# Patient Record
Sex: Female | Born: 1945 | Race: White | Hispanic: No | Marital: Single
Health system: Southern US, Community
[De-identification: ages and names within clinical notes are randomized; demographics above are authoritative.]

---

## 2003-09-26 ENCOUNTER — Other Ambulatory Visit: Payer: Self-pay

## 2004-02-05 ENCOUNTER — Other Ambulatory Visit: Payer: Self-pay

## 2004-02-07 ENCOUNTER — Other Ambulatory Visit: Payer: Self-pay

## 2004-05-27 ENCOUNTER — Other Ambulatory Visit: Payer: Self-pay

## 2004-08-16 ENCOUNTER — Other Ambulatory Visit: Payer: Self-pay

## 2004-08-16 ENCOUNTER — Inpatient Hospital Stay: Payer: Self-pay | Admitting: Internal Medicine

## 2004-11-18 ENCOUNTER — Other Ambulatory Visit: Payer: Self-pay

## 2004-11-18 ENCOUNTER — Emergency Department: Payer: Self-pay | Admitting: Emergency Medicine

## 2004-12-03 ENCOUNTER — Inpatient Hospital Stay: Payer: Self-pay | Admitting: Internal Medicine

## 2005-01-15 ENCOUNTER — Emergency Department: Payer: Self-pay | Admitting: Emergency Medicine

## 2005-01-25 ENCOUNTER — Ambulatory Visit: Payer: Self-pay | Admitting: Specialist

## 2005-02-02 ENCOUNTER — Emergency Department: Payer: Self-pay | Admitting: Emergency Medicine

## 2005-02-03 ENCOUNTER — Ambulatory Visit: Payer: Self-pay | Admitting: Emergency Medicine

## 2005-03-28 ENCOUNTER — Emergency Department: Payer: Self-pay | Admitting: Emergency Medicine

## 2005-03-28 ENCOUNTER — Other Ambulatory Visit: Payer: Self-pay

## 2005-04-06 ENCOUNTER — Ambulatory Visit: Payer: Self-pay | Admitting: Family Medicine

## 2005-04-12 ENCOUNTER — Ambulatory Visit: Payer: Self-pay | Admitting: Family Medicine

## 2005-05-22 ENCOUNTER — Other Ambulatory Visit: Payer: Self-pay

## 2005-05-22 ENCOUNTER — Emergency Department: Payer: Self-pay | Admitting: Emergency Medicine

## 2005-07-16 ENCOUNTER — Other Ambulatory Visit: Payer: Self-pay

## 2005-07-16 ENCOUNTER — Inpatient Hospital Stay: Payer: Self-pay | Admitting: Internal Medicine

## 2005-12-15 ENCOUNTER — Ambulatory Visit: Payer: Self-pay | Admitting: Gastroenterology

## 2005-12-27 ENCOUNTER — Ambulatory Visit: Payer: Self-pay | Admitting: Gastroenterology

## 2005-12-28 ENCOUNTER — Ambulatory Visit: Payer: Self-pay | Admitting: Gastroenterology

## 2006-02-24 ENCOUNTER — Emergency Department: Payer: Self-pay | Admitting: Emergency Medicine

## 2006-02-24 ENCOUNTER — Other Ambulatory Visit: Payer: Self-pay

## 2006-03-04 ENCOUNTER — Ambulatory Visit: Payer: Self-pay | Admitting: Cardiology

## 2006-10-15 ENCOUNTER — Other Ambulatory Visit: Payer: Self-pay

## 2006-10-15 ENCOUNTER — Inpatient Hospital Stay: Payer: Self-pay | Admitting: *Deleted

## 2006-10-16 ENCOUNTER — Other Ambulatory Visit: Payer: Self-pay

## 2006-10-18 ENCOUNTER — Other Ambulatory Visit: Payer: Self-pay

## 2006-10-19 ENCOUNTER — Other Ambulatory Visit: Payer: Self-pay

## 2007-01-09 ENCOUNTER — Emergency Department: Payer: Self-pay | Admitting: Emergency Medicine

## 2007-01-09 ENCOUNTER — Other Ambulatory Visit: Payer: Self-pay

## 2007-03-02 ENCOUNTER — Ambulatory Visit: Payer: Self-pay | Admitting: Gastroenterology

## 2007-07-17 ENCOUNTER — Other Ambulatory Visit: Payer: Self-pay

## 2007-07-17 ENCOUNTER — Inpatient Hospital Stay: Payer: Self-pay | Admitting: Internal Medicine

## 2008-01-18 ENCOUNTER — Ambulatory Visit: Payer: Self-pay | Admitting: Specialist

## 2008-03-15 ENCOUNTER — Ambulatory Visit: Payer: Self-pay | Admitting: Specialist

## 2008-04-19 ENCOUNTER — Ambulatory Visit: Payer: Self-pay | Admitting: Specialist

## 2008-04-20 ENCOUNTER — Inpatient Hospital Stay: Payer: Self-pay | Admitting: Internal Medicine

## 2008-04-20 ENCOUNTER — Other Ambulatory Visit: Payer: Self-pay

## 2008-07-02 ENCOUNTER — Ambulatory Visit: Payer: Self-pay | Admitting: Gastroenterology

## 2008-07-03 ENCOUNTER — Ambulatory Visit: Payer: Self-pay | Admitting: Gastroenterology

## 2008-08-15 ENCOUNTER — Ambulatory Visit: Payer: Self-pay | Admitting: Internal Medicine

## 2010-03-07 ENCOUNTER — Ambulatory Visit: Payer: Self-pay | Admitting: Internal Medicine

## 2011-03-17 ENCOUNTER — Ambulatory Visit: Payer: Self-pay

## 2013-01-31 ENCOUNTER — Inpatient Hospital Stay: Payer: Self-pay | Admitting: Internal Medicine

## 2013-01-31 LAB — CBC WITH DIFFERENTIAL/PLATELET
Basophil %: 0.9 %
Eosinophil %: 0.7 %
HCT: 37.3 % (ref 35.0–47.0)
MCH: 25.7 pg — ABNORMAL LOW (ref 26.0–34.0)
MCHC: 31.4 g/dL — ABNORMAL LOW (ref 32.0–36.0)
Monocyte %: 5.6 %
Neutrophil #: 10.3 10*3/uL — ABNORMAL HIGH (ref 1.4–6.5)
Neutrophil %: 64.1 %
RBC: 4.56 10*6/uL (ref 3.80–5.20)
RDW: 15.4 % — ABNORMAL HIGH (ref 11.5–14.5)
WBC: 16.1 10*3/uL — ABNORMAL HIGH (ref 3.6–11.0)

## 2013-01-31 LAB — TROPONIN I: Troponin-I: <0.02 ng/mL

## 2013-01-31 LAB — PRO B NATRIURETIC PEPTIDE: B-Type Natriuretic Peptide: 56 pg/mL (ref 0–125)

## 2013-01-31 LAB — COMPREHENSIVE METABOLIC PANEL
Albumin: 3.4 g/dL (ref 3.4–5.0)
Alkaline Phosphatase: 103 U/L (ref 50–136)
Anion Gap: 3 — ABNORMAL LOW (ref 7–16)
Bilirubin,Total: 0.3 mg/dL (ref 0.2–1.0)
Calcium, Total: 8.8 mg/dL (ref 8.5–10.1)
Co2: 31 mmol/L (ref 21–32)
Creatinine: 0.88 mg/dL (ref 0.60–1.30)
EGFR (Non-African Amer.): >60
Glucose: 220 mg/dL — ABNORMAL HIGH (ref 65–99)
Potassium: 4.5 mmol/L (ref 3.5–5.1)
Total Protein: 7.6 g/dL (ref 6.4–8.2)

## 2013-02-01 LAB — BASIC METABOLIC PANEL
Anion Gap: 7 (ref 7–16)
BUN: 20 mg/dL — ABNORMAL HIGH (ref 7–18)
Calcium, Total: 8.8 mg/dL (ref 8.5–10.1)
Chloride: 97 mmol/L — ABNORMAL LOW (ref 98–107)
Co2: 28 mmol/L (ref 21–32)
Creatinine: 0.75 mg/dL (ref 0.60–1.30)
EGFR (Non-African Amer.): >60
Glucose: 222 mg/dL — ABNORMAL HIGH (ref 65–99)
Sodium: 132 mmol/L — ABNORMAL LOW (ref 136–145)

## 2013-02-01 LAB — CBC WITH DIFFERENTIAL/PLATELET
Basophil #: 0.1 10*3/uL (ref 0.0–0.1)
Eosinophil #: 0 10*3/uL (ref 0.0–0.7)
HCT: 34.2 % — ABNORMAL LOW (ref 35.0–47.0)
HGB: 10.5 g/dL — ABNORMAL LOW (ref 12.0–16.0)
Lymphocyte #: 1 10*3/uL (ref 1.0–3.6)
MCHC: 30.7 g/dL — ABNORMAL LOW (ref 32.0–36.0)
MCV: 82 fL (ref 80–100)
Monocyte %: 0.6 %
Neutrophil #: 10.7 10*3/uL — ABNORMAL HIGH (ref 1.4–6.5)
Platelet: 199 10*3/uL (ref 150–440)
WBC: 11.8 10*3/uL — ABNORMAL HIGH (ref 3.6–11.0)

## 2013-02-01 LAB — HEMOGLOBIN A1C: Hemoglobin A1C: 8 % — ABNORMAL HIGH (ref 4.2–6.3)

## 2013-02-01 LAB — LIPID PANEL
Cholesterol: 169 mg/dL (ref 0–200)
Triglycerides: 109 mg/dL (ref 0–200)

## 2013-02-05 DIAGNOSIS — I509 Heart failure, unspecified: Secondary | ICD-10-CM

## 2013-02-05 LAB — CBC WITH DIFFERENTIAL/PLATELET
HCT: 33.1 % — ABNORMAL LOW (ref 35.0–47.0)
HGB: 10.6 g/dL — ABNORMAL LOW (ref 12.0–16.0)
Lymphocyte #: 0.5 10*3/uL — ABNORMAL LOW (ref 1.0–3.6)
Lymphocyte %: 5.5 %
MCHC: 32 g/dL (ref 32.0–36.0)
MCV: 82 fL (ref 80–100)
Neutrophil #: 8.5 10*3/uL — ABNORMAL HIGH (ref 1.4–6.5)
Neutrophil %: 89.6 %
RBC: 4.03 10*6/uL (ref 3.80–5.20)

## 2013-02-05 LAB — PRO B NATRIURETIC PEPTIDE: B-Type Natriuretic Peptide: 139 pg/mL — ABNORMAL HIGH (ref 0–125)

## 2013-02-05 LAB — BASIC METABOLIC PANEL
BUN: 40 mg/dL — ABNORMAL HIGH (ref 7–18)
Chloride: 95 mmol/L — ABNORMAL LOW (ref 98–107)
Creatinine: 0.93 mg/dL (ref 0.60–1.30)
EGFR (African American): >60
Glucose: 248 mg/dL — ABNORMAL HIGH (ref 65–99)
Potassium: 4.9 mmol/L (ref 3.5–5.1)

## 2013-02-05 LAB — PLATELET COUNT: Platelet: 174 10*3/uL (ref 150–440)

## 2013-02-06 LAB — CULTURE, BLOOD (SINGLE)

## 2013-02-09 LAB — BASIC METABOLIC PANEL
BUN: 38 mg/dL — ABNORMAL HIGH (ref 7–18)
Calcium, Total: 8.7 mg/dL (ref 8.5–10.1)
Chloride: 87 mmol/L — ABNORMAL LOW (ref 98–107)
Co2: 43 mmol/L (ref 21–32)
Creatinine: 0.72 mg/dL (ref 0.60–1.30)
EGFR (African American): >60
EGFR (Non-African Amer.): >60
Glucose: 263 mg/dL — ABNORMAL HIGH (ref 65–99)
Potassium: 5.3 mmol/L — ABNORMAL HIGH (ref 3.5–5.1)
Sodium: 134 mmol/L — ABNORMAL LOW (ref 136–145)

## 2013-02-09 LAB — CBC WITH DIFFERENTIAL/PLATELET
Comment - H1-Com1: NORMAL
HCT: 34.3 % — ABNORMAL LOW (ref 35.0–47.0)
Lymphocytes: 6 %
MCHC: 31.3 g/dL — ABNORMAL LOW (ref 32.0–36.0)
Monocytes: 5 %
Myelocyte: 1 %
RDW: 15.4 % — ABNORMAL HIGH (ref 11.5–14.5)
Segmented Neutrophils: 88 %
WBC: 14.8 10*3/uL — ABNORMAL HIGH (ref 3.6–11.0)

## 2013-02-20 ENCOUNTER — Ambulatory Visit: Payer: Self-pay | Admitting: Nurse Practitioner

## 2013-02-26 ENCOUNTER — Inpatient Hospital Stay: Payer: Self-pay | Admitting: Specialist

## 2013-02-26 LAB — CBC
HGB: 11.9 g/dL — ABNORMAL LOW (ref 12.0–16.0)
MCV: 82 fL (ref 80–100)
Platelet: 148 10*3/uL — ABNORMAL LOW (ref 150–440)
RBC: 4.46 10*6/uL (ref 3.80–5.20)
RDW: 17 % — ABNORMAL HIGH (ref 11.5–14.5)

## 2013-02-26 LAB — CK TOTAL AND CKMB (NOT AT ARMC)
CK, Total: 42 U/L (ref 21–215)
CK-MB: 0.9 ng/mL (ref 0.5–3.6)

## 2013-02-26 LAB — URINALYSIS, COMPLETE
Bacteria: NONE SEEN
Blood: NEGATIVE
Glucose,UR: NEGATIVE mg/dL (ref 0–75)
Leukocyte Esterase: NEGATIVE
Nitrite: NEGATIVE
Ph: 6 (ref 4.5–8.0)
Protein: NEGATIVE
RBC,UR: NONE SEEN /HPF (ref 0–5)
Squamous Epithelial: <1
WBC UR: 1 /HPF (ref 0–5)

## 2013-02-26 LAB — COMPREHENSIVE METABOLIC PANEL
Albumin: 3.4 g/dL (ref 3.4–5.0)
Alkaline Phosphatase: 58 U/L (ref 50–136)
Anion Gap: 7 (ref 7–16)
BUN: 29 mg/dL — ABNORMAL HIGH (ref 7–18)
Calcium, Total: 9.3 mg/dL (ref 8.5–10.1)
Co2: 35 mmol/L — ABNORMAL HIGH (ref 21–32)
EGFR (Non-African Amer.): 53 — ABNORMAL LOW
Glucose: 221 mg/dL — ABNORMAL HIGH (ref 65–99)
Osmolality: 270 (ref 275–301)
Potassium: 4.2 mmol/L (ref 3.5–5.1)
SGOT(AST): 27 U/L (ref 15–37)
SGPT (ALT): 63 U/L (ref 12–78)
Sodium: 128 mmol/L — ABNORMAL LOW (ref 136–145)
Total Protein: 7.1 g/dL (ref 6.4–8.2)

## 2013-02-26 LAB — PRO B NATRIURETIC PEPTIDE: B-Type Natriuretic Peptide: 93 pg/mL (ref 0–125)

## 2013-02-26 LAB — SODIUM, URINE, RANDOM: Sodium, Urine Random: 49 mmol/L (ref 20–110)

## 2013-02-26 LAB — TROPONIN I
Troponin-I: <0.02 ng/mL
Troponin-I: <0.02 ng/mL

## 2013-02-27 LAB — CBC WITH DIFFERENTIAL/PLATELET
Basophil %: 0.2 %
Eosinophil #: 0 10*3/uL (ref 0.0–0.7)
Eosinophil %: 0 %
HCT: 32.9 % — ABNORMAL LOW (ref 35.0–47.0)
HGB: 10.7 g/dL — ABNORMAL LOW (ref 12.0–16.0)
Lymphocyte %: 14.8 %
MCH: 26.6 pg (ref 26.0–34.0)
MCV: 81 fL (ref 80–100)
Monocyte #: 0.1 x10 3/mm — ABNORMAL LOW (ref 0.2–0.9)
Monocyte %: 2.1 %
Neutrophil %: 82.9 %
RBC: 4.04 10*6/uL (ref 3.80–5.20)
RDW: 16.7 % — ABNORMAL HIGH (ref 11.5–14.5)
WBC: 6 10*3/uL (ref 3.6–11.0)

## 2013-02-27 LAB — BASIC METABOLIC PANEL
Calcium, Total: 8.5 mg/dL (ref 8.5–10.1)
Chloride: 87 mmol/L — ABNORMAL LOW (ref 98–107)
Creatinine: 0.84 mg/dL (ref 0.60–1.30)
EGFR (Non-African Amer.): >60
Osmolality: 273 (ref 275–301)
Sodium: 130 mmol/L — ABNORMAL LOW (ref 136–145)

## 2013-02-27 LAB — LIPID PANEL
HDL Cholesterol: 91 mg/dL — ABNORMAL HIGH (ref 40–60)
Ldl Cholesterol, Calc: 113 mg/dL — ABNORMAL HIGH (ref 0–100)
Triglycerides: 142 mg/dL (ref 0–200)
VLDL Cholesterol, Calc: 28 mg/dL (ref 5–40)

## 2013-02-28 DIAGNOSIS — I509 Heart failure, unspecified: Secondary | ICD-10-CM

## 2013-02-28 DIAGNOSIS — R609 Edema, unspecified: Secondary | ICD-10-CM

## 2013-02-28 DIAGNOSIS — R0602 Shortness of breath: Secondary | ICD-10-CM

## 2013-02-28 LAB — SODIUM: Sodium: 130 mmol/L — ABNORMAL LOW (ref 136–145)

## 2013-03-01 LAB — PHOSPHORUS: Phosphorus: 3.2 mg/dL (ref 2.5–4.9)

## 2013-03-01 LAB — T4, FREE: Free Thyroxine: 0.98 ng/dL (ref 0.76–1.46)

## 2013-03-01 LAB — TSH: Thyroid Stimulating Horm: 0.374 u[IU]/mL — ABNORMAL LOW

## 2013-03-02 DIAGNOSIS — I5031 Acute diastolic (congestive) heart failure: Secondary | ICD-10-CM

## 2013-03-04 LAB — CREATININE, SERUM
EGFR (African American): >60
EGFR (Non-African Amer.): >60

## 2013-03-06 ENCOUNTER — Encounter: Payer: Self-pay | Admitting: Internal Medicine

## 2013-03-22 ENCOUNTER — Ambulatory Visit: Payer: Self-pay | Admitting: Nurse Practitioner

## 2013-03-22 DEATH — deceased

## 2014-10-18 IMAGING — US US EXTREM LOW VENOUS*R*
1 series · 14 of 23 positions shown · non-contrast
Comparison: none

REASON FOR EXAM: swelling
COMMENTS:

[Series 1: us extrem low venous*right* · 0.10mm/px · 14 of 23 slices shown]
[im 1/23]
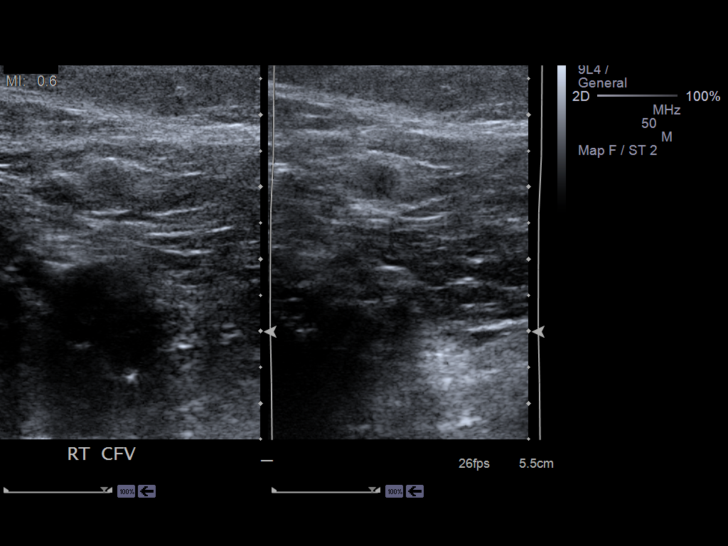
[im 3/23]
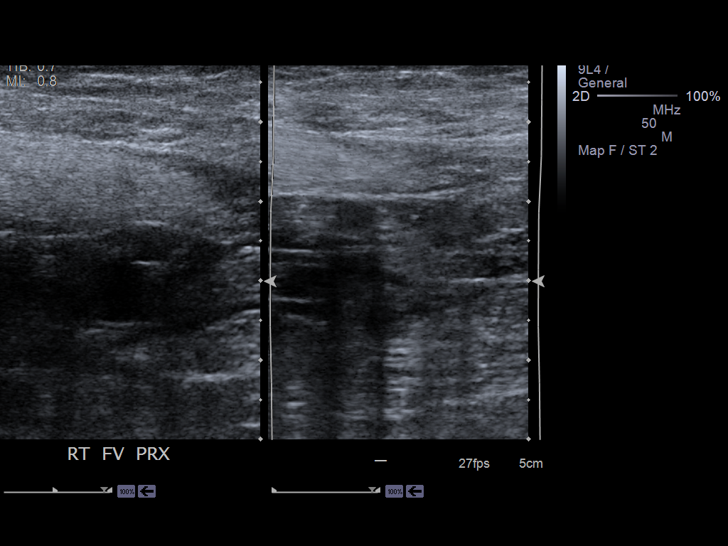
[im 5/23]
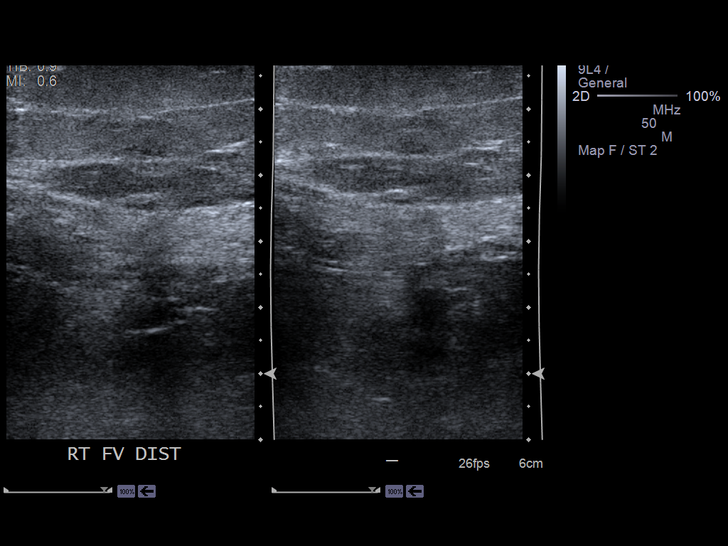
[im 6/23]
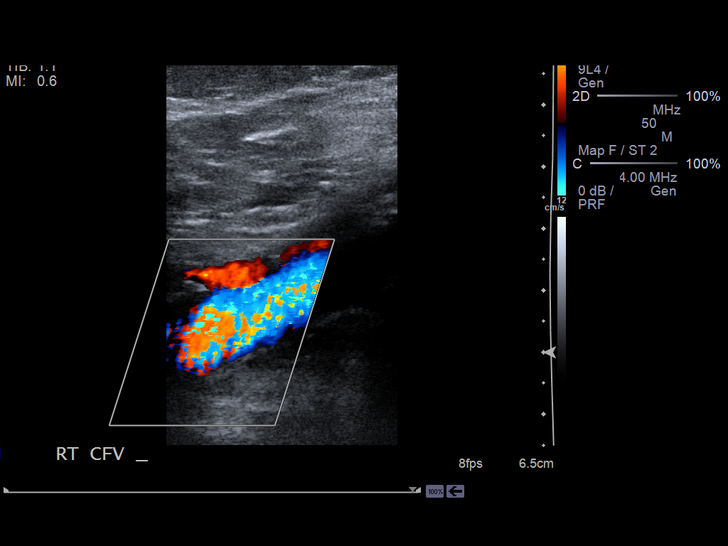
[im 8/23]
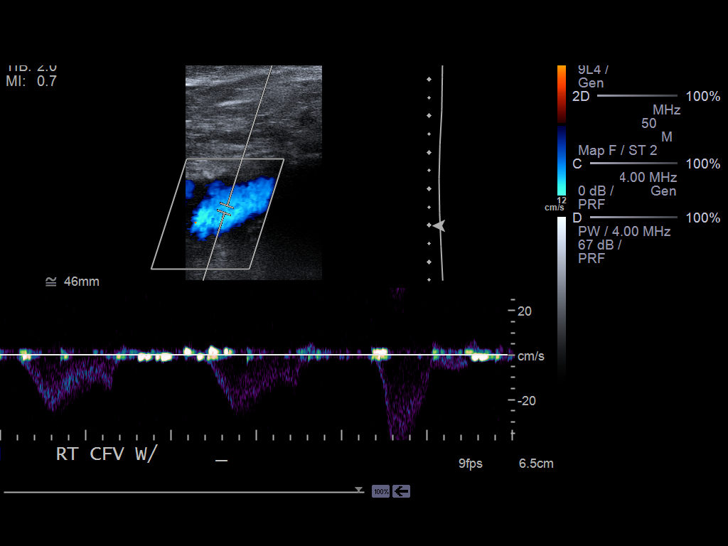
[im 10/23]
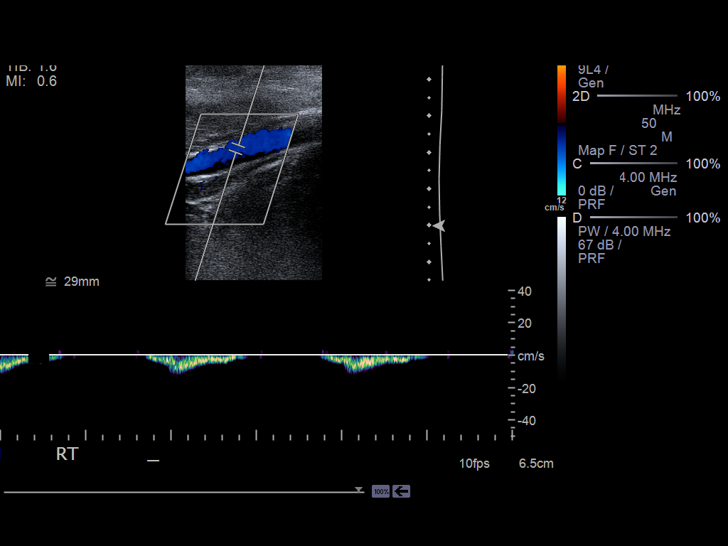
[im 11/23]
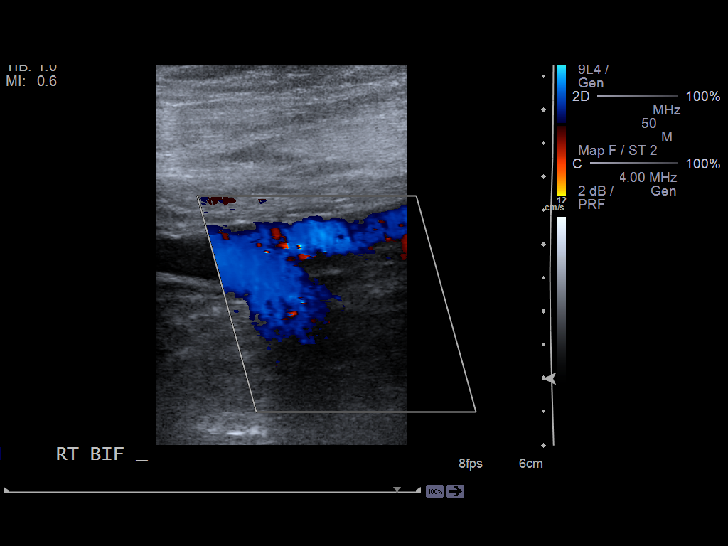
[im 13/23]
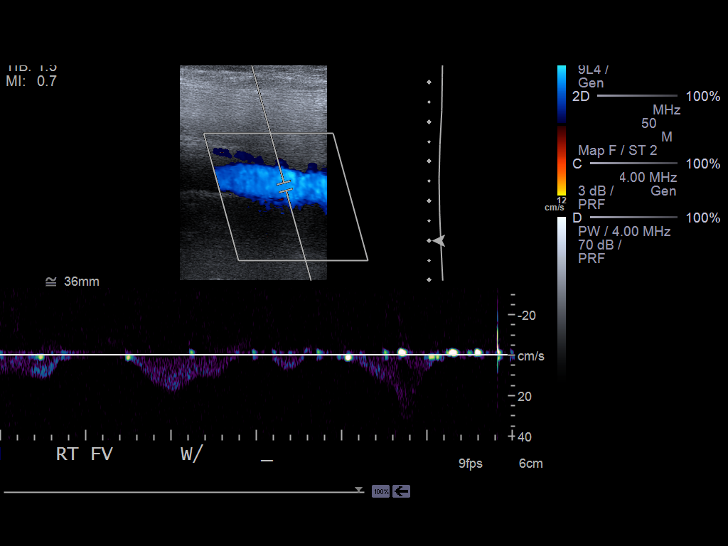
[im 14/23]
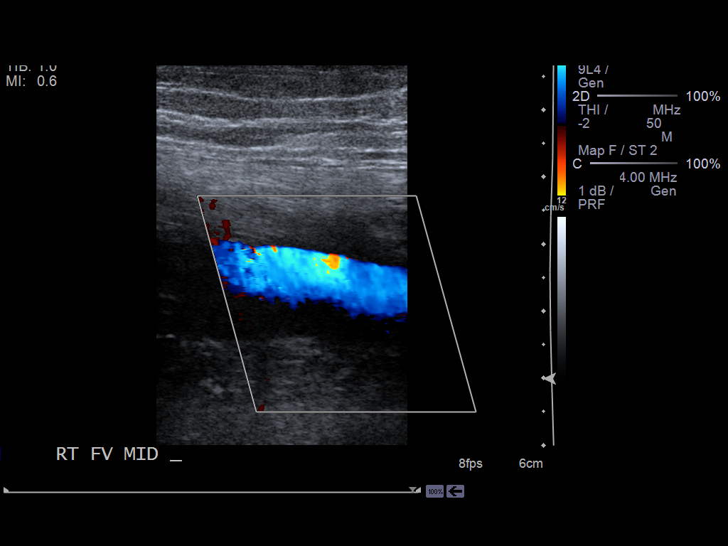
[im 16/23]
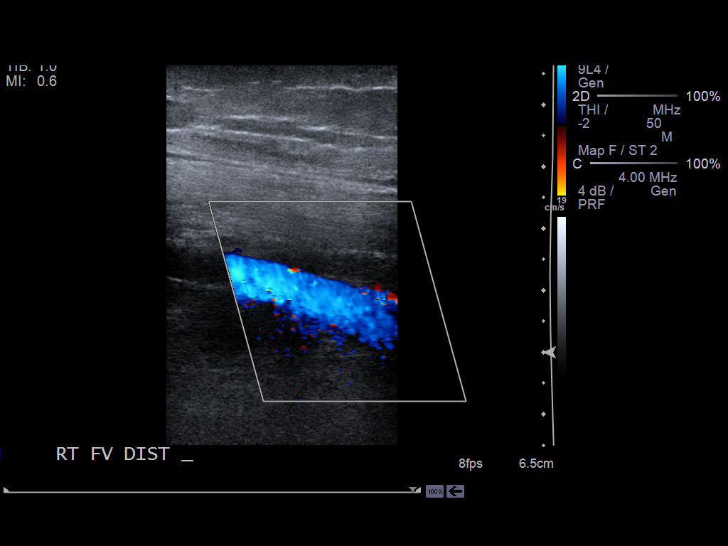
[im 18/23]
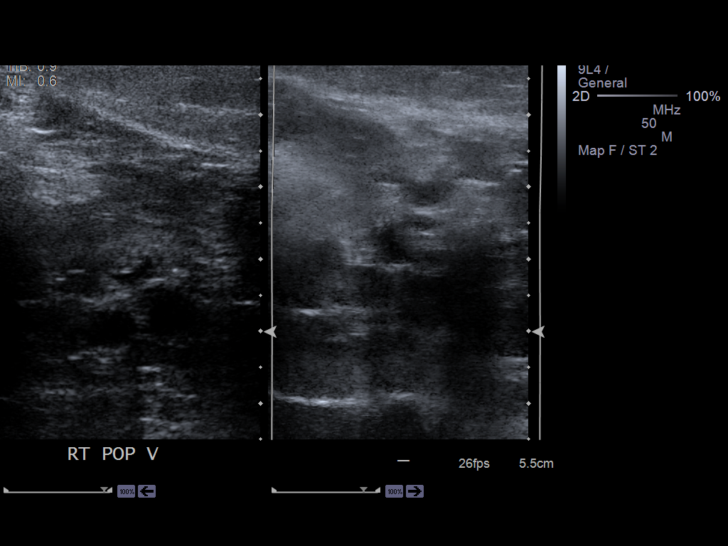
[im 19/23]
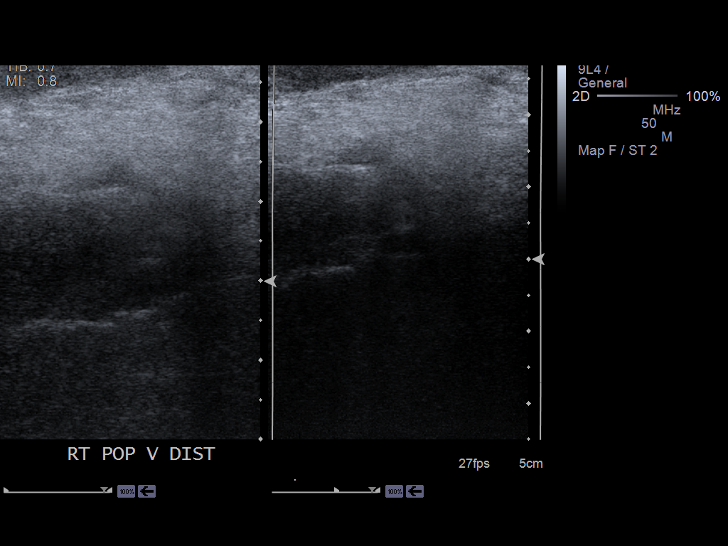
[im 21/23]
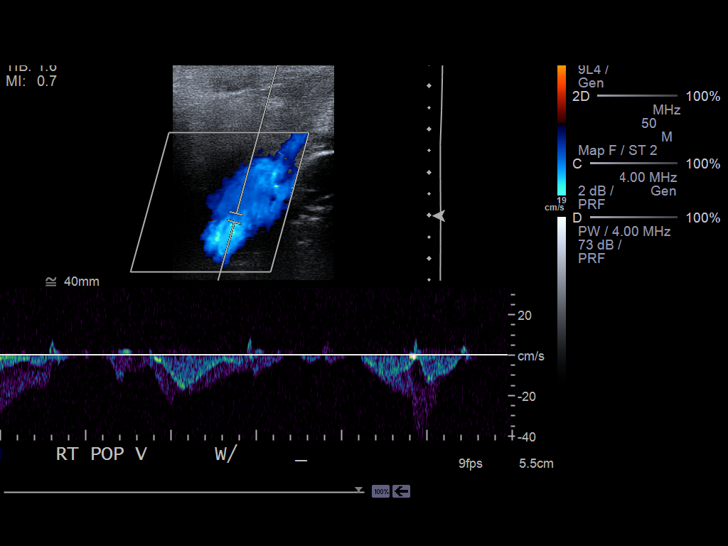
[im 23/23]
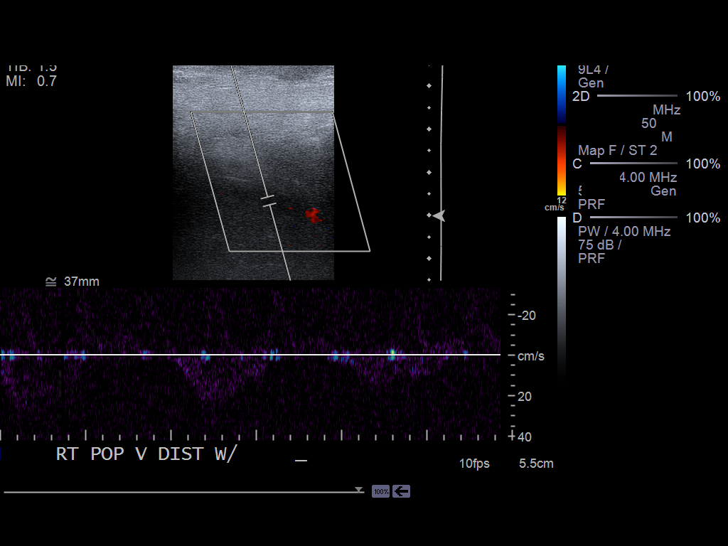

[14 of 23 positions shown; findings below may reference images not displayed]

PROCEDURE:     US  - US DOPPLER LOW EXTR RIGHT  - February 03, 2013  [DATE]

RESULT:     Comparison: None

Technique and findings: Multiple longitudinal and transverse grayscale as
well as color and spectral Doppler images of the right lower extremity veins
were obtained from the common femoral veins through the popliteal veins.

The right common femoral vein and femoral vein are compressible. There is
color flow within these vessels. There is normal augmentation waveforms.

The popliteal vein is not well evaluated secondary to patient body habitus
and soft tissue edema. Flow is demonstrated within the popliteal vein.
However, compression maneuvers could not be adequately evaluated.
IMPRESSION: No evidence of deep venous thrombosis in the right lower extremity.
Evaluation for nonocclusive thrombus is limited in the right popliteal vein
secondary to patient body habitus and soft tissue edema.

[REDACTED]

## 2014-10-18 IMAGING — CT CT CHEST W/ CM
1 series · 14 of 32 positions shown, 18 images · IV contrast (isovue)
Comparison: none

REASON FOR EXAM: hypoxia, right lower ext swelling
COMMENTS:

PROCEDURE:     CT  - CT CHEST (FOR PE) W  - February 03, 2013 [DATE]
RESULT:     Comparison: 04/21/2008
TECHNIQUE: Multiple thin section axial images were obtained from the lung
apices to the upper abdomen following 80 ml Isovue 370 intravenous contrast,
according to the PE protocol. These images were also reviewed on a Siemens
multiplanar work station.

[Series 4: soft tissue · axial · 0.71mm/px · z∈[+892,+1192]mm · 14 of 112 slices shown, 18 images]
[im 8/112  soft-tissue]
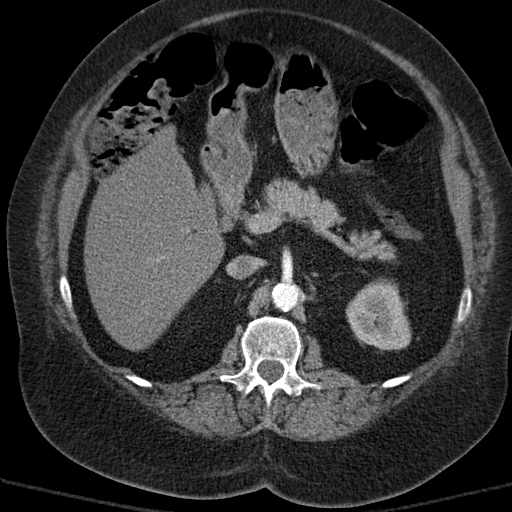
[im 8/112  bone]
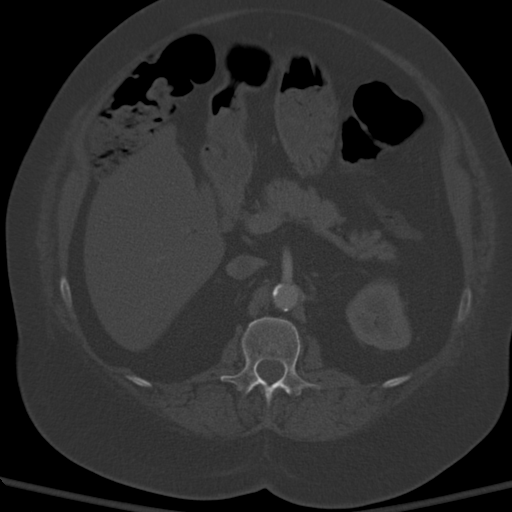
[im 15/112  soft-tissue]
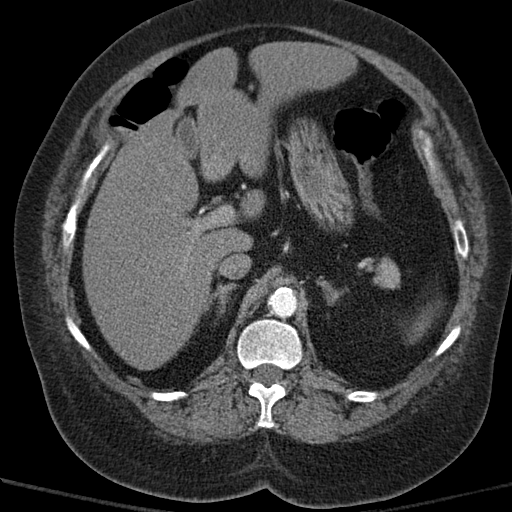
[im 26/112  soft-tissue]
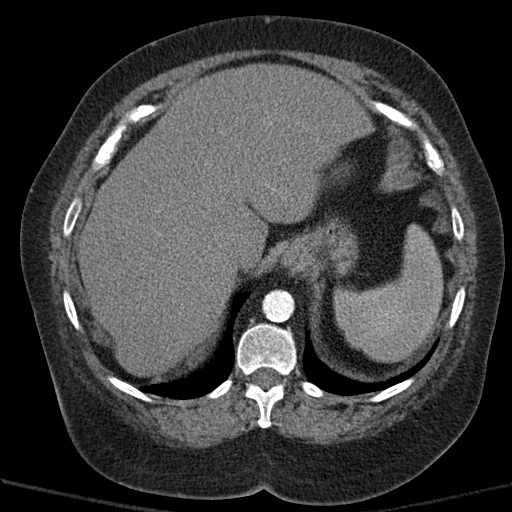
[im 33/112  soft-tissue]
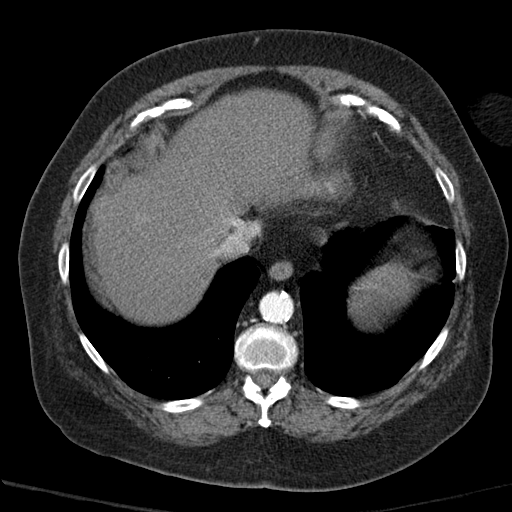
[im 43/112  soft-tissue]
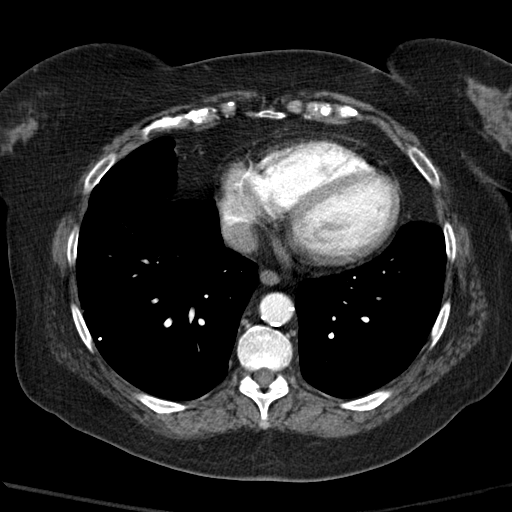
[im 51/112  soft-tissue]
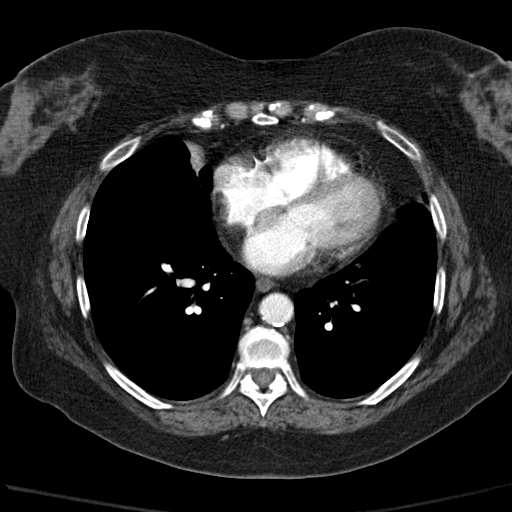
[im 61/112  soft-tissue]
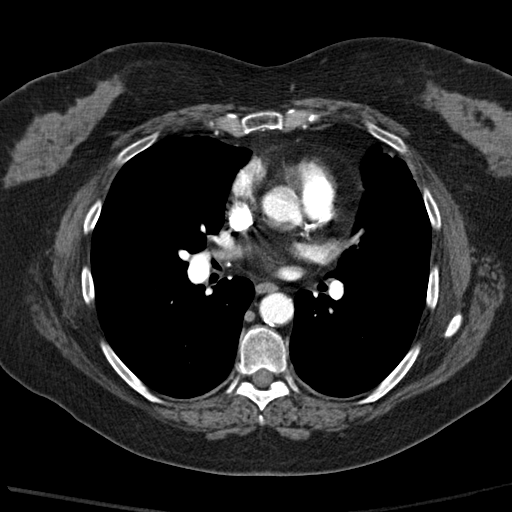
[im 69/112  soft-tissue]
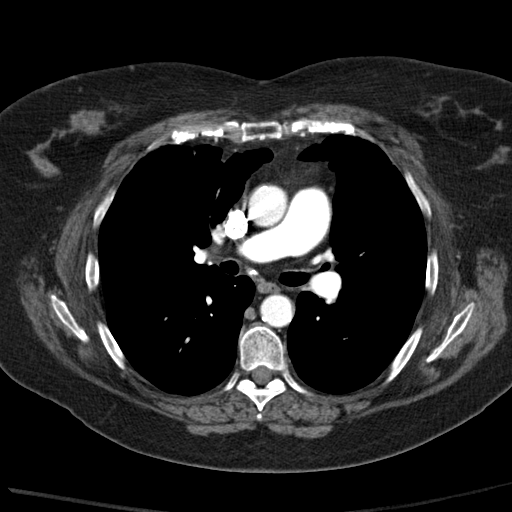
[im 79/112  soft-tissue]
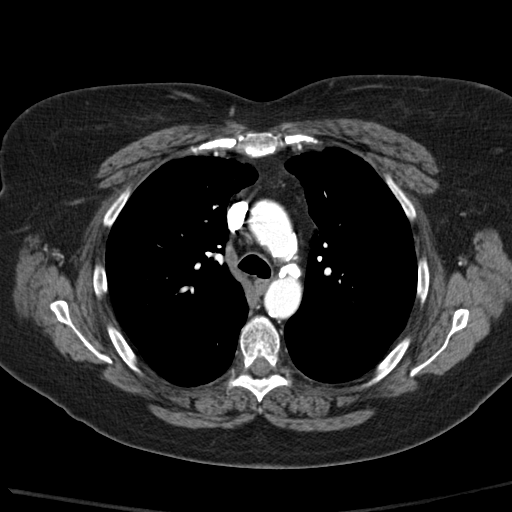
[im 79/112  bone]
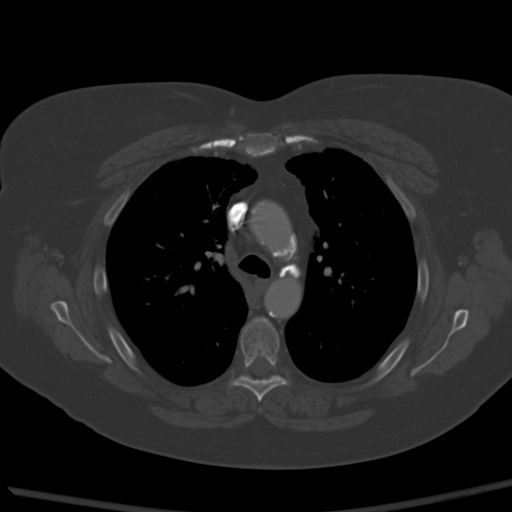
[im 86/112  soft-tissue]
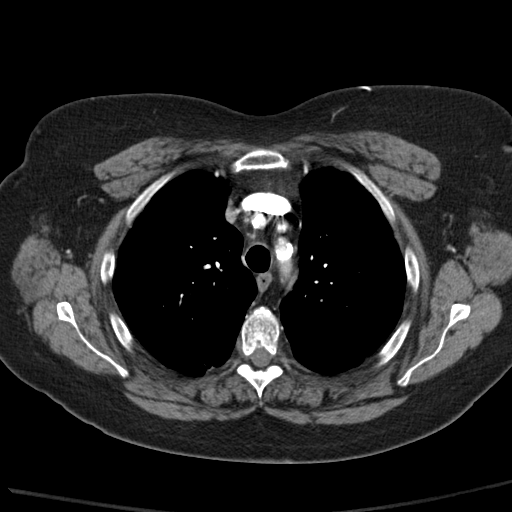
[im 97/112  soft-tissue]
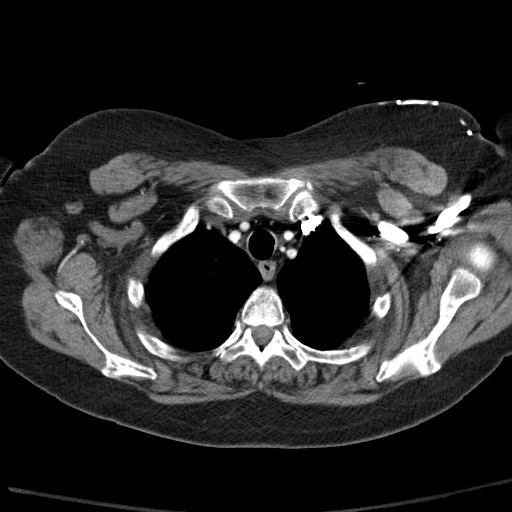
[im 97/112  lung]
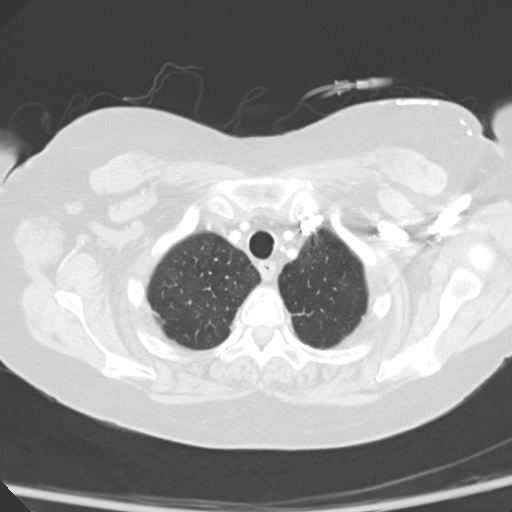
[im 101/112  lung]
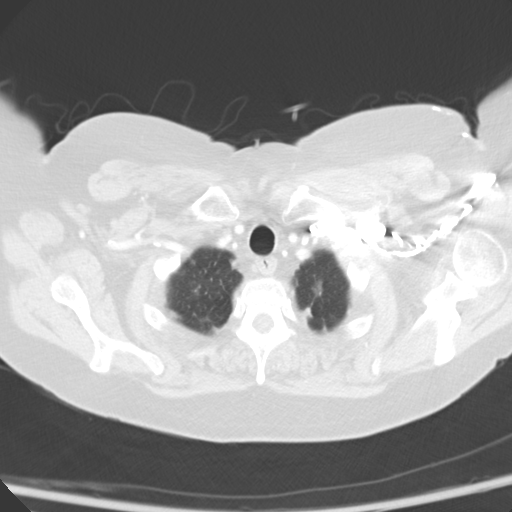
[im 104/112  soft-tissue]
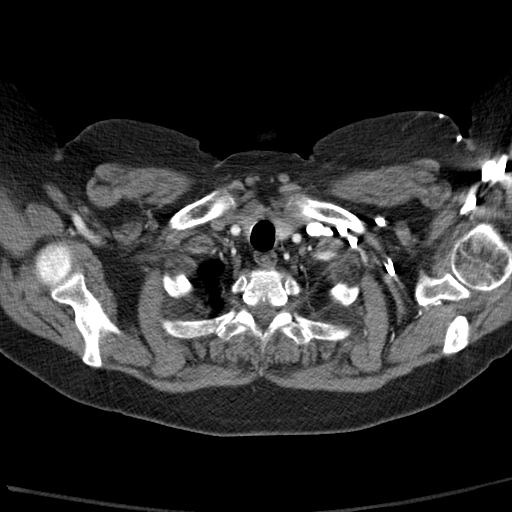
[im 104/112  lung]
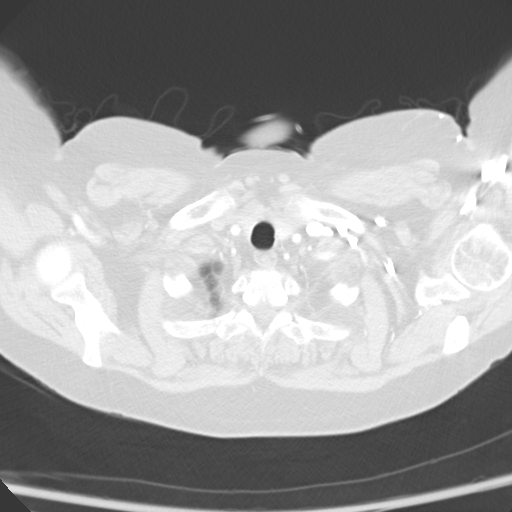
[im 108/112  lung]
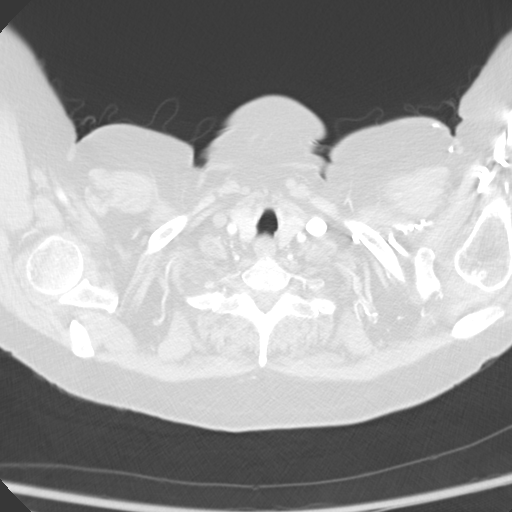

[14 of 32 positions shown; findings below may reference images not displayed]

FINDINGS: No mediastinal, hilar, or axillary lymphadenopathy. There are calcifications
in the subcarinal lymph node. Calcifications are seen in the coronary
arteries. Small focus of hyperenhancement in the right hepatic lobe is too
small to characterize, but likely represents a small perfusion anomaly.
Small nodule in the left adrenal gland is consistent with an adenoma. There
is a 5 mm low-attenuation focus in the body of the pancreas. This is too
small to characterize, but indeterminate.

The thoracic aorta is normal in caliber. Evaluation of the segmental
arteries is limited by respiratory motion artifact. Subtle area of
peripheral low-attenuation in a segmental pulmonary artery in the left lower
lobe may be artifactual. A subtle pulmonary embolus would be difficult to
exclude. No central or lobar pulmonary embolus identified.

Mild subpleural opacities in the right and left upper lobes are similar to
prior and likely related to scarring. Mild biapical subpleural opacities are
similar to prior and likely secondary to scarring. Mild subpleural opacities
in the right middle lobe and lingula are similar to prior and likely
secondary to scarring. There is a calcified subcentimeter nodule in the
right lower lobe. A 5 mm nodule in the left lower lobe is similar to prior
given differences in slice selection. 3 mm nodule in the left lower lobe is
similar to prior. 4 mm nodule in the right upper lobe is similar to prior
given differences in slice selection. Other less than 4 mm nodule are
similar to prior. The central airways are patent.

No aggressive lytic or sclerotic osseous lesions are identified.
IMPRESSION: 1. No central or lobar pulmonary embolus identified. Evaluation of the
segmental pulmonary arteries is limited. Subtle peripheral low-attenuation
in a segmental pulmonary artery in the left lower lobe may be artifactual. A
subtle age indeterminate segmental pulmonary embolus would be difficult to
exclude. Doppler ultrasound of the lower extremities could be performed for
further evaluation, as indicated.
2. Subcentimeter low-attenuation focus in the pancreas is too small to
characterize, but indeterminate. Followup contrast-enhanced abdominal CT is
recommended in 6 months to ensure stability.

[REDACTED]

## 2014-10-19 IMAGING — CR DG CHEST 1V PORT
1 series · 1 of 1 positions shown · non-contrast
Comparison: none

REASON FOR EXAM: check central line placement
COMMENTS:

PROCEDURE:     DXR - DXR PORTABLE CHEST SINGLE VIEW  - February 04, 2013 [DATE]
RESULT:     Comparison: 02/01/2013

[ap]
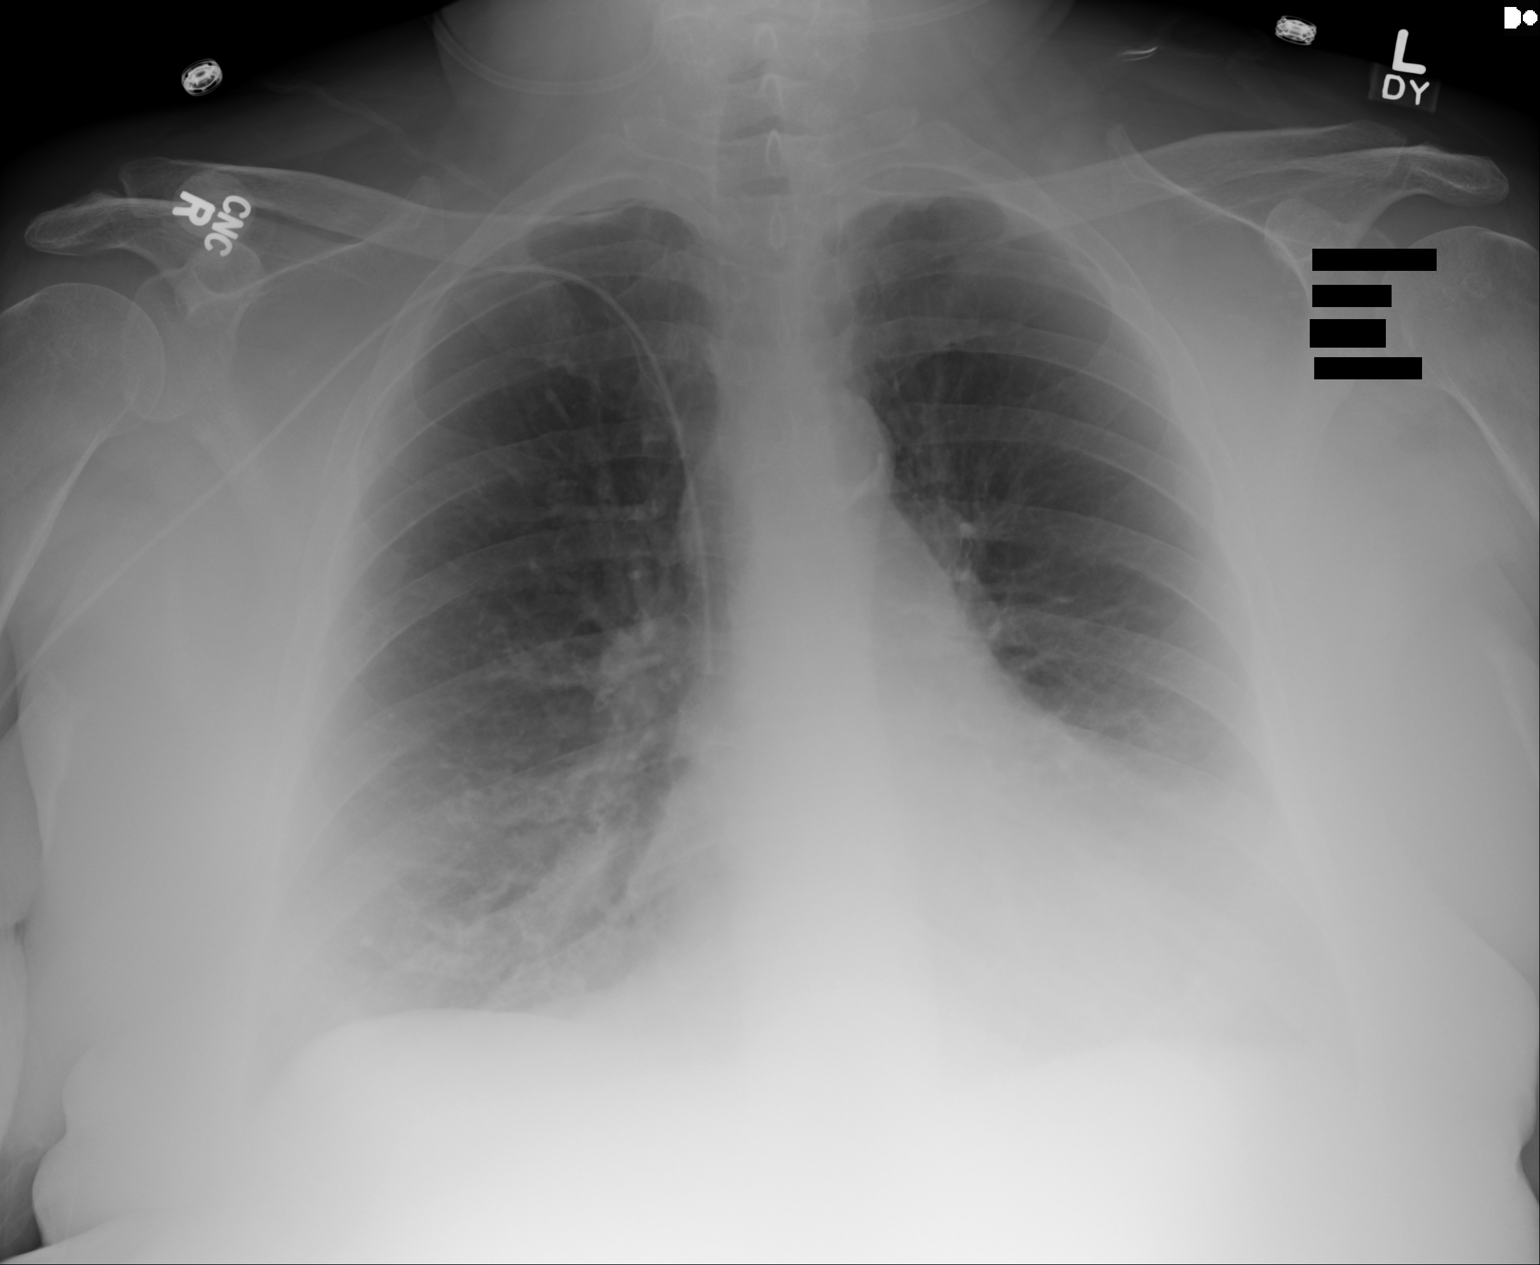

[1 of 1 positions shown; findings below may reference images not displayed]

FINDINGS: Right PICC catheter tip terminates over the SVC. Cardiomegaly and the
mediastinum are similar to prior. Increased density in the lower lungs is
likely related to overlying soft tissue. Mild basilar opacities cannot be
excluded. Followup PA and lateral chest radiograph is suggested.
IMPRESSION: Right PICC catheter tip overlies the SVC. Other findings as above.

[REDACTED]

## 2015-03-14 NOTE — Consult Note (Signed)
Chief Complaint:  Chief Complaint Capacity making decision and Depression.   History of Present Illness:  History of Present Illness Pt is a 69 year old female with past medical history of COPD, diabetes and on home oxygen, presented to the Emergency Room with complaint of shortness of breath. She said that until 1 or 2 days ago she was in her baseline, which is she has to use oxygen 2 liters at home via nasal cannula, and she is somewhat short of breath with exertion, but able to manage her day-to-day activities. She states that in the last few years she never had exacerbation episodes of COPD, so her COPD is very well under control with her nebulizers at home and oxygen supplementation.   Recently she started experiencing shortness of breath and was uanble to breathe and was brought to hospital for stabilization. Psych Consult was placed as pt is not participating in PT and she staying most of the time in bed. She has been isolating herslef. She was using walker at home for ambulation.  During my interview, pt acknowledged that she has been in hospital for the past 1 week. She stated that she does not want to go to [facility] as she will be too far from her family as her daughters come to visit her quite often. She stated that she is feeling somewhat better now and is hopeful that with continued treatment she will start getting better.  I had a lengthy discussion with Melissa, who alo reported that pt has started showing improvement in her COPD and she is now complaint with her medications. She however appeared depressed due to staying in bed for prolonged paeriods of time. daughter stated that she lives in [place] and visist her often. She stated that she will thinks that if her depression will get better, she might take care of herself. She was concerned about the cost of the medication, as she does not have insurance for the meds. Pt denied Si/HI or plans.   Target Symptoms:  Depressive Interest  Loss  Depressed Mood   Anxiety Somatic   Arousal/Cognitive Inattention  Decreased Concentration   Capacity Recognizes the presence of illlness  Understands consequences of treatment refusal  Understands risks, benefits, alternatives  Exhibits acceptable reasoning/judgment  Communicates clear choices   Past Psychiatric Treatment: First Treatment: No h/o psychiatric treatment in past.   Current Psychotropic Medications: none.  Substance Abuse- Alcohol: The patient denies any use of alcohol..  Substance Abuse- Opiates: The patient denies any current abuse of opiates or history of opiate abuse..  Substance Abuse- Cannabis: The patient denies any cannabis use.Marland Kitchen  PAST MEDICAL & SURGICAL HX:  Significant Events:   Diabetes:    Asthma:    Emphysema:    Angina:    Lumbago:    Hypertension:    COPD:    appy:    hysterectomy:    Colonoscopy:   CURRENT OUTPATIENT MEDICATIONS:  Home Medications: Medication Instructions Status  Flovent 220 mcg/inh inhalation aerosol with adapter 2 puff(s) inhaled 2 times a day  Active  albuterol-ipratropium 2.5 mg-0.5 mg/3 mL inhalation solution 3 milliliter(s) inhaled every 4 hours, As Needed- for Shortness of Breath  Active  Aspirin Low Dose 81 mg oral delayed release tablet 1 tab(s) orally once a day Active  Diltiazem Hydrochloride ER 180 mg/24 hours oral capsule, extended release 1 cap(s) orally once a day Active  lisinopril 5 mg oral tablet 1 tab(s) orally once a day Active  Serevent 50 mcg inhalation powder 1  puff(s) inhaled 2 times a day Active  metformin 500 mg oral tablet 1 tab(s) orally once a day Active  furosemide 20 mg oral tablet 1 tab(s) orally once a day Active   Family History: The patient denies any history of mental illness in the family..  Social History: Lives alone. Ex husband passed away in 200. Supports herslef on SSDI.  Mental Status Exam:  Mental Status Exam Moderately built female lying in bed.   Speech  Monotone   Mood Depressed   Affect Depressed  Blunted   Thought Processes Linear/logical/goal directed   Orientation Self  Place  Time  Person   Attention Awake  Oriented   Concentration Fair   Memory Intact   Fund of Knowledge Fair   Language Fair   Judgement Fair   Insight Fair   Reliabiity Fair   Suicide Risk Assessment: Suicide Risk Level No risk inidicated.  Review of Systems:  Review of Systems:  Medications/Allergies Reviewed Medications/Allergies reviewed   NURSING FLOWSHEETS:  Vital Signs/Nurse Notes-CM: **Vital Signs.:   20-Mar-14 11:20  Vital Signs Type Post Fall  Pulse Pulse 71  Respirations Respirations 19  Systolic BP Systolic BP 149  Diastolic BP (mmHg) Diastolic BP (mmHg) 78  Mean BP 101  Pulse Ox % Pulse Ox % 96  Pulse Ox Activity Level  At rest  Oxygen Delivery 2L  *Intake and Output.:   Shift 20-Mar-14 15:00  Grand Totals Intake:  240 Output:      Net:  240 24 Hr.:  240  Oral Intake      In:  240  Length of Stay Totals Intake:  3360 Output:  [id number]    Net:  -7235   Assessment & Diagnosis: Axis I: MDD, Single episode Moderate.   Axis II: none.   Axis III: see PMH.  Treatment Plan: Patient is aware of and understands the risks and benefits of the proposed treatment or treatment changes..   Patient understands the risks and benefits of the alternative treatments..   Patient understands the risks and benefits of doing nothing..   Pt discussed about her illness and demonstrated clear understanding about the reason for admission. She has insight about the treatment and can discuss choices. She has the capacity to make decisions about her treatment and her daughter is supportive. She asked about starting her on Prozac which is more affordable for the pt. Daughter is willing to take pt home once she is clinically stable for discharge.  No Si/HI noted.   Thank you for allowing me to participate in care of this pt.  Electronic  Signatures: Rhunette CroftFaheem, Luwanda Starr S (MD)  (Signed 20-Mar-14 15:40)  Authored: Chief Complaint, History of Present Illness, Target Symptoms, Past Psychiatric Treatment, Substance Abuse History, PAST MEDICAL & SURGICAL HX, CURRENT OUTPATIENT MEDICATIONS, Family History, Social History, Mental Status Exam, Suicide Risk Assessment, Review of Systems, NURSING FLOWSHEETS, Assessment & Diagnosis, Treatment Plan   Last Updated: 20-Mar-14 15:40 by Rhunette CroftFaheem, Kostas Marrow S (MD)

## 2015-03-14 NOTE — Consult Note (Signed)
General Aspect 69 year old female with end-stage COPD, on oxygen, recent admission in 01/2013 for SOB, COPD exacerbation, on chronic steroids, presents with shortness of breath, chest xray concerning for CHF. Cardiology was consulted fo SOB, possible CHF.  She was admitted 01/31/2013 to 02/09/2013, d/ced to [facility] in [place], discharged several days ago. On the way home, they needed to use the restroom at [facility] and she acutely developed shortness of breath, got very nauseated, had difficulty getting into bathroom. Legs were weak, she was hyperventilating.   When they finally got home, she got to the front step and was unable to get into the house secondary to leg weakness. They called [facility] back, and they advised her to go to the ER.    In the ER, she was found to be wheezing. They did a D-dimer, which was borderline at 0.66. Troponin was negative. Currently she feels back to baseline. She is deconditoned, has not walked much over the past month, even in rehab. Daughter feesl she was d/ced too early and PT did not help her to get stronger.   PAST MEDICAL HISTORY:   Chronic respiratory failure, on chronic oxygen,  COPD,  chronic diastolic congestive heart failure,  hypertension,  diabetes, depression,  obesity.    PAST SURGICAL HISTORY:   Hysterectomy, kidney stones, appendectomy.   ALLERGIES:   PENICILLIN, LEVAQUIN and THEOPHYLLINE.   SOCIAL HISTORY:   Worked in the [employer] in the past. Stopped smoking 7 years ago. No alcohol. No drug use. Was currently at [facility] re[facility].    FAMILY HISTORY:   Mother died of breast cancer recently. Father died in his 57s of an MI.   Physical Exam:  GEN well developed, well nourished, no acute distress, obese, on two liters [place]   HEENT hearing intact to voice, moist oral mucosa   NECK supple  No masses   RESP normal resp effort  clear BS   CARD Regular rate and rhythm  No murmur   ABD denies tenderness  soft   LYMPH negative  neck   EXTR negative edema   SKIN normal to palpation   NEURO motor/sensory function intact   PSYCH alert, A+O to time, place, person, good insight   Review of Systems:  Subjective/Chief Complaint SOB, improved   General: Fatigue  Weakness   Skin: No Complaints   ENT: No Complaints   Eyes: No Complaints   Respiratory: Short of breath   Cardiovascular: No Complaints   Gastrointestinal: No Complaints   Genitourinary: No Complaints   Vascular: No Complaints   Musculoskeletal: No Complaints   Neurologic: No Complaints   Hematologic: No Complaints   Endocrine: No Complaints   Psychiatric: No Complaints   Review of Systems: All other systems were reviewed and found to be negative   Medications/Allergies Reviewed Medications/Allergies reviewed     Diabetes:    Asthma:    Emphysema:    Angina:    Lumbago:    Hypertension:    COPD:    appy:    hysterectomy:    Colonoscopy:        Admit Diagnosis:   ACUTE RESP FAILURE: Onset Date: 28-Feb-2013, Status: Active, Description: ACUTE RESP FAILURE  Home Medications: Medication Instructions Status  furosemide 20 mg oral tablet 3 tab(s) orally once a day Active  fluticasone nasal 2 spray(s) nasal once a day Active  pantoprazole 40 mg oral delayed release tablet 1 tab(s) orally once a day Active  FLUoxetine 20 mg oral capsule 1 cap(s) orally  once a day Active  glipiZIDE 5 mg oral tablet 1 tab(s) orally 2 times a day (before meals) Active  metFORMIN 750 mg oral tablet, extended release 1 tab(s) orally 2 times a day Active  diltiazem 90 mg oral tablet 0.5 tab(s) orally every 6 hours Active  MiraLax - oral powder for reconstitution 17 gram(s) orally once a day Active  predniSONE 10 mg oral tablet 1 tab(s) orally once a day Active  Symbicort 2 puff(s) inhaled 2 times a day Active  oxybutynin 5 mg oral tablet 1 tab(s) orally 2 times a day Active  docusate sodium 100 mg oral capsule 1 cap(s) orally once a day  Active  albuterol-ipratropium 2.5 mg-0.5 mg/3 mL inhalation solution 3 milliliter(s) inhaled every 6 hours, As Needed - for Wheezing Active   Lab Results: Routine Chem:  08-Apr-14 06:28   Sodium, Serum  130  Glucose, Serum  244  BUN  25  Creatinine (comp) 0.84  Potassium, Serum 3.6  Chloride, Serum  87  CO2, Serum  38  Calcium (Total), Serum 8.5  Anion Gap  5  Osmolality (calc) 273  eGFR (African American) >60  eGFR (Non-African American) >60 (eGFR values <57m/min/1.73 m2 may be an indication of chronic kidney disease (CKD). Calculated eGFR is useful in patients with stable renal function. The eGFR calculation will not be reliable in acutely ill patients when serum creatinine is changing rapidly. It is not useful in  patients on dialysis. The eGFR calculation may not be applicable to patients at the low and high extremes of body sizes, pregnant women, and vegetarians.)  Cholesterol, Serum  232  Triglycerides, Serum 142  HDL (INHOUSE)  91  VLDL Cholesterol Calculated 28  LDL Cholesterol Calculated  113 (Result(s) reported on 27 Feb 2013 at 07:01AM.)  Cardiac:  08-Apr-14 06:28   Troponin I < 0.02 (0.00-0.05 0.05 ng/mL or less: NEGATIVE  Repeat testing in 3-6 hrs  if clinically indicated. >0.05 ng/mL: POTENTIAL  MYOCARDIAL INJURY. Repeat  testing in 3-6 hrs if  clinically indicated. NOTE: An increase or decrease  of 30% or more on serial  testing suggests a  clinically important change)  Routine Hem:  08-Apr-14 06:28   Hemoglobin (CBC)  10.7  WBC (CBC) 6.0  RBC (CBC) 4.04  Hematocrit (CBC)  32.9  Platelet Count (CBC)  148  MCV 81  MCH 26.6  MCHC 32.6  RDW  16.7  Neutrophil % 82.9  Lymphocyte % 14.8  Monocyte % 2.1  Eosinophil % 0.0  Basophil % 0.2  Neutrophil # 5.0  Lymphocyte #  0.9  Monocyte #  0.1  Eosinophil # 0.0  Basophil # 0.0 (Result(s) reported on 27 Feb 2013 at 06:56AM.)   EKG:  Interpretation EKG shows sinus rhythm with rate 98 bpm. no  significant St or T wave changes   Radiology Results: XRay:    09-Apr-14 09:37, Chest PA and Lateral  Chest PA and Lateral   REASON FOR EXAM:    pneumonia?  COMMENTS:       PROCEDURE: DXR - DXR CHEST PA (OR AP) AND LATERAL  - Feb 28 2013  9:37AM     RESULT: Comparison is made to the study of February 26, 2013.    The lungs are adequately inflated. The perihilar lung markings have   become more prominent. There remain increased lung markings in the   infrahilar region on the right. The left heart border is less well   demonstrated today suggesting developing atelectasis in the  lingula.   There is no pleural effusion. The cardiac silhouette is mildly enlarged   and the pulmonary vascularity is prominent.    IMPRESSION:  The findings suggest interval development of CHF. This is     superimposed upon findings of atelectasis or infiltrate in the right   infrahilar region little changed from 7 April and associated with new   atelectasis of the lingula.        Verified By: DAVID A. Martinique, M.D., MD  CT:    08-Apr-14 17:01, CT Chest Without Contrast  CT Chest Without Contrast   REASON FOR EXAM:    pneumonia? or CHF?, presents with hypoxia  COMMENTS:       PROCEDURE: CT  - CT CHEST WITHOUT CONTRAST  - Feb 27 2013  5:01PM     RESULT: Comparison: 02/03/2013    Technique: Multiple axial images of the chest were obtained without   intravenous contrast.    Findings:  No mediastinal, hilar, or axillary lymphadenopathy. Small low-attenuation   nodule in the subcutaneous fat of the lateral right thorax is   nonspecific, but likely represents a sebaceous cyst. Calcifications are   seen in the coronary arteries. There are several calcified small     gallstones within the gallbladder. There is mild thickening of the left   adrenal gland, without definite discrete nodule.    Mild biapical subpleural opacities are similar to at least 04/21/2008, and   likely related to scarring. There are  several 4 mm nodules in the   bilateral lungs which are similar to prior studies. Subpleural triangular   opacity in the anterior right middle lobe is similar to prior studies and   likely related to an area of scarring. Similar findings are seen more   superiorly in the subpleural right lower lobe. There is an irregular   nodular density in the anterior left upper lobe which measures   approximately 1.3 cm in greatest dimension, image 40.This is new from   prior studies. There is some adjacent scarring/fibrosis. The central   airways are patent. Minimal basilar ground glass opacities are likely   secondary to atelectasis.  No aggressive lytic or sclerotic osseous lesions are identified.    IMPRESSION:   1. Small irregular nodular density in the anterior left upper lobe may   represent an area of atelectasis, but is new from prior studies and is   indeterminate. Followup noncontrast chest CT is recommended in 3 months.  2. Other findings as above.        Verified By: Gregor Hams, M.D., MD    Penicillin: Hives  Levaquin: Other  Theophylline: Other  Vital Signs/Nurse's Notes: **Vital Signs.:   09-Apr-14 18:03  Vital Signs Type Q 4hr  Temperature Temperature (F) 98.3  Celsius 36.8  Temperature Source oral  Pulse Pulse 85  Respirations Respirations 18  Systolic BP Systolic BP 950  Diastolic BP (mmHg) Diastolic BP (mmHg) 58  Mean BP 73  Pulse Ox % Pulse Ox % 99  Pulse Ox Activity Level  At rest  Oxygen Delivery 2L    Impression 69 year old female with end-stage COPD, on oxygen, recent admission in 01/2013 for SOB, COPD exacerbation, on chronic steroids, presents with shortness of breath, chest xray concerning for CHF. Cardiology was consulted fo SOB, possible CHF. Case discussed with Dr. Raul Del, patient and her daughter.  1) SOB: Events that lead to admission suggestive of profound leg weakness and inabilty to ambulate. Possible panic attack and  hyperventilation.  Unable to exclude COPD flare and component of diastlic CHF as she does report LEg edema worse in the past few days.(now better with legs elevated). --Will defer pulmonary mangement to Dr. Raul Del --Would continue gentle diuresis to keep slightly negative, monitoring renal function. --May need lasix 40 mg daily. She takes low dose lasix at home on regular basis. No additional cardiac workup at this time as she is less symptomatic.  2) Leg weakness She reports inadequate PT over at [facility] Details unavailable. Uncertain if she did not want to participate or if it was not offered . Daughter does not think she was strong enough to live by herself and do ADLs. --Needs PT eval  3) COPD, oxygen dependant on oxygen 2 lietsr,  steroids and other meds per medical service and Dr. Raul Del  4) Diastolic CHF Recent echo with normal RVSP unable to exclude obesity hypoventilation syndrome --gentle diuresis.   Electronic Signatures: Ida Rogue (MD)  (Signed 09-Apr-14 18:44)  Authored: General Aspect/Present Illness, History and Physical Exam, Review of System, Past Medical History, Health Issues, Home Medications, Labs, EKG , Radiology, Allergies, Vital Signs/Nurse's Notes, Impression/Plan   Last Updated: 09-Apr-14 18:44 by Ida Rogue (MD)

## 2015-03-14 NOTE — Discharge Summary (Signed)
PATIENT NAME:  Jenna Richardson, Dinesha F MR#:  [id number] DATE OF BIRTH:  November 30, 1945  DATE OF ADMISSION:  02/26/2013 DATE OF DISCHARGE:  03/03/2013  For detailed note, please see the history and physical done on admission by Dr. Alford Highlandichard Wieting.   DIAGNOSES AT DISCHARGE: As follows: 1. Acute and chronic respiratory failure secondary to chronic obstructive pulmonary disease exacerbation.  2. Chronic obstructive pulmonary disease exacerbation.  3. Obesity Pickwickian syndrome.  4. Diabetes.  5. Hypertension.  6. Anxiety/depression.  7. History of diastolic congestive heart failure.  8. Oral thrush.   DISPOSITION: The patient is being discharged to a skilled nursing facility.   DISCHARGE MEDICATIONS: Fluoxetine 20 mg daily, glipizide 5 mg b.i.d., Flonase two sprays to each nostril daily, Protonix 40 mg daily, metformin 750 mg b.i.d., Lasix 60 mg daily, diltiazem 45 mg q.6 hours, MiraLax daily as needed, prednisone taper starting at 60 mg down to 10 mg over the next 12 days then to continue a baseline dose at 10 mg daily, Symbicort 160/4.5 two puffs b.i.d., oxybutynin 5 mg b.i.d., Colace 100 mg daily, DuoNebs q.6 hours as needed, buspirone 5 mg b.i.d., Spiriva one puff daily and Nystatin swish and swallow 5 mL q.6 hours x 7 days.   CONSULTANTS: During the hospital course were Dr. Meredeth IdeFleming from Pulmonary, Dr. Brandy HaleUzma Faheem from Psychiatry and Dr. Lorine BearsMuhammad Arida from Cardiology.   PERTINENT STUDIES: Done during the hospital course are as follows: A chest x-ray done on admission showing findings suggestive of mild CHF. A CT chest done without contrast on admission showing small irregular nodular density in the anterior left upper lobe may be representing an area of atelectasis. Otherwise, no acute process noted. Dopplers of the lower extremities bilaterally showing no evidence of any occlusive DVT.  HOSPITAL COURSE: This is a 69 year old female who presented to the hospital on 02/26/2013 secondary to shortness of  breath and a mildly elevated D-dimer.   1. Acute on chronic respiratory failure. The most likely cause of the patient's shortness of breath is probably severe COPD complicated with underlying anxiety. The patient there was managed aggressively with IV steroids, around-the-clock nebulizer treatments, empiric antibiotics and also her maintenance inhalers. Her shortness of breath has significantly improved since admission, although she still continues to have significant shortness of breath on minimal exertion which is likely going to be her baseline given her severe underlying COPD. She was already on oxygen at home prior to coming in, but given her severe shortness of breath and minimal exertion, she is not safe to be discharged home. Therefore, she is being discharged to a skilled nursing facility. She will finish treatment for her COPD with a prednisone taper along with her Symbicort, Spiriva was added given her end-stage COPD and her DuoNebs as stated.  2. COPD exacerbation. This was likely the cause of the patient's acute respiratory failure. The patient likely had acute on chronic respiratory failure given her COPD. She was managed aggressively with IV steroids, around-the-clock nebulizer treatments for maintenance of her Symbicort, added some Spiriva. She was also placed on p.o. doxycycline, although her chest x-ray did not show any acute pneumonia. She has been afebrile. Her blood and sputum cultures have been negative. She presently is being discharged to a skilled nursing facility on a long prednisone taper along with maintenance of her inhalers and DuoNebs as stated. The patient has received about 7 days of p.o. doxycycline, therefore is not being discharged on any antibiotics presently.  3. Diabetes. The patient's blood sugars have  been somewhat labile given the fact that she has been on high dose steroids, although this likely should improve once the steroids have been tapered. Presently, she will  continue her glipizide and metformin as stated.  4. Anxiety/panic attack. This was probably contributing to her worsening shortness of breath. She was seen by Psychiatry, started on some Prozac along with on some BuSpar. She will continue those upon discharge at the skilled nursing facility.  5. Oral thrush. This is probably secondary to the fact she is taking inhaled corticosteroids along with long-term IV steroids. I am discharging her on some p.o. Nystatin as stated.  6. History of diastolic CHF. There was some concern that her shortness of breath initially was possibly due to congestive heart failure, although her BNP was only 93. She was seen by Cardiology,  Dr. Kirke Corin, who did not think that the patient's shortness of breath was CHF, but likely COPD. 7. Hypertension. The patient has remained hemodynamically stable on her Cardizem and she will resume that upon discharge.   CODE STATUS: The patient is a full code.   DISPOSITION: She is being discharged to a skilled nursing facility.   Time spent on discharge is 40 minutes.      ____________________________ Rolly Pancake. Cherlynn Kaiser, MD vjs:es D: 03/05/2013 15:04:37 ET T: 03/05/2013 15:32:06 ET JOB#: [id number]  cc: Rolly Pancake. Cherlynn Kaiser, MD, <Dictator> Houston Siren MD ELECTRONICALLY SIGNED 03/08/2013 21:35

## 2015-03-14 NOTE — Consult Note (Signed)
PATIENT NAME:  Jenna Richardson, HISER MR#:  [id number] DATE OF BIRTH:  11-18-1946  DATE OF CONSULTATION:  03/01/2013  REFERRING PHYSICIAN:  Leo Grosser, MD CONSULTING PHYSICIAN:  Ardeen Fillers. Garnetta Buddy, MD  REASON FOR CONSULTATION:  Anxiety.   HISTORY OF PRESENT ILLNESS:  The patient is a 69 year old obese white female with history of end-stage COPD admitted again to the hospital after her recent discharge due to shortness of breath. The patient was recently in the hospital from January 31, 2013 to February 09, 2013 and was discharged to [facility] in [place]. She completed her rehab and was discharged from the [facility]. On her way home, she stopped with her daughter to use the restroom at the local [employer]. She started developing shortness of breath over there and it took her at least 20 minutes to use the bathroom. When she finally got back into her car, her daughter noted that she was having severe shortness of breath. They called [facility] back and was advised to come to the Emergency Room. Since the patient lives alone at the house, the patient was brought back to the Emergency Room. In the ER, her D-dimer was noted to be slightly elevated. The hospitalist service was contacted for further evaluation and admission.   During my evaluation, the patient was noted to be lying on her bed. She reported that she has been feeling tired since she came to the hospital. She reported that she is not feeling well and has been having abdominal pain and is unable to eat well. She does not remember if she has eaten anything which might be causing abdominal pain. She reported that she only ate a few bites of lunch this afternoon. She stated that she has been feeling very weak and tired and has been having poor sleep. Reported that she did well at [employer] and did whatever they were asking her to do to improve her symptoms. She was taking her medications as prescribed and was not having any anxiety or depressive  symptoms. The patient reported that now she is feeling very tired and does not want to participate much in the interview. Most of the information was obtained from her chart. The patient reported that her daughter, Efraim Kaufmann, is very supportive and is helping her with her medications.   PAST PSYCHIATRIC HISTORY: The patient has a history of depression and anxiety in the past and she was started on Prozac at her previous hospitalization to which she responded well and is currently taking 20 mg as prescribed. She reported that she does not have any side effects to her current psychotropic medications. She also does not have any history of suicidal or homicidal ideations or plans.   PAST MEDICAL HISTORY:   1.  COPD on chronic oxygen.  2.  Chronic diastolic congestive heart failure.  3.  Hypertension.  4.  Diabetes.  5.  Obesity.  6.  Hysterectomy.  7.  Kidney stones.  8.  Appendectomy.   ALLERGIES:  PENICILLIN, LEVAQUIN, THEOPHYLLINE.   SOCIAL HISTORY:  The patient currently lives with her daughter at home. She used to work in the [employer] in the past and stopped smoking 7 years ago.   FAMILY HISTORY:  The patient has history of breast cancer in her mother who recently passed away due to the same and father died in the 59s due to an MI.   CURRENT HOME MEDICATIONS:  Protonix 40 mg daily, MiraLAX 70 mg daily, prednisone 10 mg, Symbicort 2 puffs  b.i.d., diltiazem 45 mg q. 6 hours, Flonase 2 sprays each nostril daily, Lasix 40 mg, oxybutynin 5 mg b.i.d., DuoNeb nebulizer q. 6 hours p.r.n., Colace 100 mg b.i.d., Prozac 20 mg daily, metformin 750 mg p.o. b.i.d., glipizide 5 mg p.o. b.i.d.   REVIEW OF SYSTEMS:  CONSTITUTIONAL: Positive for fatigue. Does not have any fever or chills or sweats. No weight changes. No weight gain.  ENT: Admits to having dry throat.  EYES: No blurry vision.  CARDIOVASCULAR: No chest pain. No palpitations.  RESPIRATORY: Has shortness of breath, occasional cough.   GASTROINTESTINAL: Complaining of abdominal pain and nausea.  GENITOURINARY: No burning on urination.  MUSCULOSKELETAL: No joint or muscle pain.  INTEGUMENTARY: No rash or eruption.   PHYSICAL EXAMINATION: VITAL SIGNS: Temperature 98, pulse 107, respirations 26, blood pressure 127/79.   LABORATORY DATA:  Glucose 287, BUN 25, creatinine 0.84, sodium 130, potassium 3.6, chloride 87, bicarbonate 88, anion gap 5, VLDL cholesterol 28, osmolarity 273, magnesium 1.7, cholesterol 232, triglycerides 142, HDL 91, thyroxine 0.98, TSH 0.3. WBC 6, RBC 4.04, hemoglobin 10.7, hematocrit 32.9, platelet count 148.   MENTAL STATUS EXAMINATION:  The patient is a morbidly obese female who was lying on the side of her bed. Her eye contact was poor. Speech was low in tone and volume. Mood was depressed. Affect was congruent. Thought process was logical. Thought content was non-delusional. She currently denied having any suicidal ideations or plans. She demonstrated fair insight and judgment.   DIAGNOSTIC IMPRESSION: AXIS I: Depressive disorder, not otherwise specified,  anxiety disorder.  AXIS II: None.  AXIS III: Review the medical history.   TREATMENT PLAN:   1.  I discussed with the patient about the medications, treatment, risks, benefits and alternatives.  2.  I will continue with Prozac 20 mg daily.  3.  I will add BuSpar 5 mg p.o. b.i.d. for her anxiety.  4.  Once her medical conditions get stabilized, she can be discharged back home with her family.   Thank you for allowing me to participate in the care of this patient.    ____________________________ Ardeen FillersUzma S. Garnetta BuddyFaheem, MD usf:si D: 03/01/2013 14:54:00 ET T: 03/01/2013 15:09:34 ET JOB#: [id number]  cc: Ardeen FillersUzma S. Garnetta BuddyFaheem, MD, <Dictator> Rhunette CroftUZMA S Jaslyn Bansal MD ELECTRONICALLY SIGNED 03/06/2013 16:17

## 2015-03-14 NOTE — H&P (Signed)
PATIENT NAME:  Jenna Richardson, Jenna Richardson MR#:  [id number] DATE OF BIRTH:  [birth year]  DATE OF ADMISSION:  01/31/2013  PRIMARY CARE PHYSICIAN:  Leo Grosser, MD  REFERRING EMERGENCY ROOM PHYSICIAN:  Dorothea Glassman, MD  CHIEF COMPLAINT:  Shortness of breath.   HISTORY OF PRESENT ILLNESS:  A 69 year old female with past medical history of COPD, diabetes and on home oxygen, presented to the Emergency Room with complaint of shortness of breath. She said that until 1 or 2 days ago she was in her baseline, which is she has to use oxygen 2 liters at home via nasal cannula, and she is somewhat short of breath with exertion, but able to manage her day-to-day activities. She states that in the last few years she never had exacerbation episodes of COPD, so her COPD is very well under control with her nebulizers at home and oxygen supplementation.   Since today morning, she started having severe shortness of breath. She tried all the nebulizers multiple times, but she could not help her breathing, and so decided to come to the Emergency Room for that. In the ER also, the ER physician tried multiple respiratory treatments, but that did not help and so she is being admitted for that.   Denies any sick contacts, any cough, sputum or fever.   REVIEW OF SYSTEMS:   GENERAL: Negative for fever, fatigue, weakness, weight loss or weight gain.  EYES: No blurring or double vision or redness.  ENT: No tinnitus, ear pain, hearing loss.  RESPIRATORY: No cough, but has severe wheezing and shortness of breath.  CARDIOVASCULAR: No chest pain, orthopnea, edema or palpitations.  GASTROINTESTINAL: No nausea, vomiting, diarrhea or abdominal pain.  GENITOURINARY: No dysuria, hematuria or increased frequency.  ENDOCRINE: No polyhydria, heat or cold intolerance.  SKIN: No rashes or lesions.  MUSCULOSKELETAL: No swelling or tenderness in the joints.  NEUROLOGICAL: No numbness, weakness or tremors.  PSYCHIATRIC: No anxiety, insomnia or  depression.   PAST MEDICAL HISTORY:  COPD and diabetes.   PAST SURGICAL HISTORY:  Hysterectomy in 1973.   ALLERGIES:  PENICILLIN, LEVAQUIN AND THEOPHYLLINE.   SOCIAL HISTORY: She smoked for many years, but stopped 6 years ago. Denies drinking, alcohol or illegal drug use. Currently, she is oxygen-dependent at home with very well-controlled COPD symptoms.   FAMILY HISTORY:  Positive for breast cancer in mother and her 3 uncles also had cancer.   HOME MEDICATIONS:  Serevent 50 mcg 1 puff 2 times a day, metformin 500 mg oral tablet once a day, lisinopril 5 mg oral tablet once a day, furosemide 20 mg oral tablet once a day, Flovent 2 puffs 2 times a day, diltiazem 180 mg extended release 1 capsule once a day, aspirin 81 mg once a day, albuterol/ipratropium nebulizer solution 3 mL every 4 hours as needed for shortness of breath.   PHYSICAL EXAMINATION: VITAL SIGNS: In the ER on presentation, temperature 98.2, blood pressure 92/50, pulse oximetry 97% with oxygen supplementation 3 to 4 liters, respiratory rate 24 to 30.  GENERAL: She is fully alert and oriented to time, place and person. She is obese and in mild distress due to respiratory problems.  HEENT: Head and neck atraumatic. Conjunctivae pink. Oral mucosa moist. No JVD.  RESPIRATORY: Bilateral extensive expiratory wheezing present. Equal air entry. Crepitations are not heard.  CARDIOVASCULAR: S1, S2 present, regular. No murmur.  ABDOMEN: Soft, nontender. Bowel sounds present. No organomegaly.  SKIN: No rashes.  JOINTS: No edema. Moves all 4 limbs.  NEUROLOGIC: Power  5/5. Follows commands. No tremor.  PSYCHIATRIC: Mildly anxious due to shortness of breath, but does not appear in any acute psychiatric distress at this time.   DIAGNOSTIC DATA:  BNP is 56. Glucose is 220, BUN 21, creatinine 0.88, sodium 135, potassium 4.5, chloride 101, CO2 31. Total protein 7.6, albumin 3.4, bilirubin 0.3, alkaline phosphate 103, SGOT 22, SGPT 24. Troponin is  less than 0.02. WBC is 16,000, RBC 4.56, hemoglobin 11.7, platelet count 219, MCV 82. D-dimer is 0.29. Chest x-ray shows cardiomegaly, right lung base atelectasis or infiltrate, COPD.   ASSESSMENT AND PLAN:  A 69 year old female with past medical history of chronic obstructive pulmonary disease and oxygen use at home, presented with sudden onset shortness of breath. No cough or fever or sputum production. No sick contacts.  1.  Chronic obstructive pulmonary disease exacerbation. Will give her treatment with intravenous Solu-Medrol. Add advair and Spiriva and nebulizer treatment as needed for shortness of breath. Will continue oxygen supplementation while she is here. SHE IS ALLERGIC TO PENICILLIN AND LEVAQUIN, so will give her erythromycin as there is some questionable atelectasis versus infiltrate, though most likely her symptoms look like due to viral because she does not have fever, cough or sputum.  2.  Hypertension. She is taking lisinopril at home, but will hold it as her blood pressure is running on the lower side.  3.  Diabetes. Will continue her home medication of metformin and put her on insulin sliding scale coverage.  4.  Obesity.  Will check her lipid panel and may start on the medication later on if found elevated.  5.  Deep vein thrombosis and gastrointestinal prophylaxis.  6. elevated WBCs- due to COPD exacerbation- will monitor 7.Hyperglycemia- steroids use- monitor- Insulin sliding scale.   CODE STATUS: FULL CODE. Healthcare power of attorney is daughter, Jenna Richardson, cell number [phone].   TOTAL TIME SPENT ON THIS ADMISSION:  55 minutes.    ____________________________ Hope PigeonVaibhavkumar G. Elisabeth PigeonVachhani, MD vgv:si D: 01/31/2013 14:59:00 ET T: 01/31/2013 15:19:51 ET JOB#: [id number]  cc: Hope PigeonVaibhavkumar G. Elisabeth PigeonVachhani, MD, <Dictator> Leo GrosserNancy J. Maloney, MD Herbon E. Meredeth IdeFleming, MD   Heath GoldVAIBHAVKUMAR [facility] LLC[facility]ACHHANI MD ELECTRONICALLY SIGNED 01/31/2013 23:08

## 2015-03-14 NOTE — H&P (Signed)
DATE OF BIRTH:  03-28-1946  DATE OF ADMISSION:  02/26/2013  PRIMARY CARE PHYSICIAN:  Dr. Elease HashimotoMaloney  CHIEF COMPLAINT:  Shortness of breath.   HISTORY OF PRESENT ILLNESS: This is a 69 year old female with end-stage COPD, on oxygen. She presents with shortness of breath Today. She was here in the hospital from 01/31/2013 to 02/09/2013, went over to [facility] and was at [facility] up until this morning, when she was discharged from [facility]. On the way heading home, [facility] needed [facility]. She developed shortness of breath there. She got very nauseated. That process back and forth to the bathroom took at least 30 minutes. When they got home, she got to the front step and was unable to get into the house secondary to shortness of breath. They called [facility] back, and they advised to go to the ER. She does live home alone. In the ER, she was found to be wheezing. They did a D-dimer, which was borderline at 0.66. Troponin was negative. Hospitalist services were contacted for further evaluation.   PAST MEDICAL HISTORY:  Chronic respiratory failure, on chronic oxygen, COPD, chronic diastolic congestive heart failure, hypertension, diabetes, depression, obesity.    PAST SURGICAL HISTORY:  Hysterectomy, kidney stones, appendectomy.   ALLERGIES:  PENICILLIN, LEVAQUIN and THEOPHYLLINE.   SOCIAL HISTORY:  Worked in the [employer] in the past. Stopped smoking 7 years ago. No alcohol. No drug use. Was currently at [facility] rehab.   FAMILY HISTORY:  Mother died of breast cancer recently. Father died in his 1360s of an MI.   MEDICATIONS:  Include Protonix 40 mg daily, MiraLAX 17 grams daily, prednisone 10 mg daily, Symbicort 2 puffs b.i.d., diltiazem 45 mg q. 6 hours, Flonase 2 sprays each nostril daily, Lasix 40 mg daily, oxybutynin 5 mg b.i.d., DuoNeb nebulizer q. 6 hours p.r.n. shortness of breath, Colace 100 mg b.i.d., fluoxetine 20 mg daily, metformin 750 mg b.i.d., glipizide 5 mg b.i.d.    REVIEW OF SYSTEMS:   CONSTITUTIONAL:  Positive for fatigue. No fever, chills, or sweats. No weight loss. No weight gain.  EARS, NOSE, MOUTH AND THROAT:  Positive for dry throat. No difficulty swallowing. No difficulty hearing.  EYES:  No blurry vision.   CARDIOVASCULAR:  No chest pain. No palpitations.  RESPIRATORY:  Positive for shortness of breath. Occasional cough, brownish phlegm.  GASTROINTESTINAL: Positive for nausea. No vomiting. No hematemesis. No abdominal pain, but does have some cramping. Positive for constipation. No bright red blood per rectum. No melena.  GENITOURINARY:  No burning on urination or hematuria.  MUSCULOSKELETAL:  No joint pain or muscle pain.  INTEGUMENT:  No rashes or eruptions.  NEUROLOGIC:  No fainting or blackouts.  PSYCHIATRIC:  On medication, history of depression.  ENDOCRINE:  No thyroid problems.  HEMATOLOGIC/LYMPHATIC:  History of anemia.   PHYSICAL EXAMINATION: VITAL SIGNS: Temperature 98, pulse 114, respirations 26, blood pressure 132/52, pulse ox 93% on oxygen.  GENERAL:  Slight respiratory distress, using accessory muscles to breathe.  EYES: Conjunctivae and lids normal. Pupils equal, round and reactive to light. Extraocular muscles intact. No nystagmus.  EARS, NOSE, MOUTH AND THROAT: Tympanic membranes:  No erythema. Nasal mucosa: No erythema.  Throat:  No erythema, no exudate seen. Lips and gums:  No lesions.  NECK: No JVD. No bruits. No lymphadenopathy. No thyromegaly. No thyroid nodules palpated.  RESPIRATORY: Positive use of accessory muscles to breathe. Decreased breath sounds bilaterally. Positive expiratory wheeze.  CARDIOVASCULAR SYSTEM: S1, S2, tachycardic. No gallops, rubs  or murmurs heard. Carotid upstroke 2+ bilaterally. No bruits. Dorsalis pedis pulses 2+ bilaterally, and 3+ edema bilateral lower extremities.  ABDOMEN:  Soft, nontender. No organomegaly/splenomegaly. Normoactive bowel sounds. No masses felt.  LYMPHATIC:  No lymph nodes  in the neck.  MUSCULOSKELETAL:  With 3+ edema. No clubbing. No cyanosis.  SKIN:  Chronic lower extremity discoloration.  NEUROLOGIC:  Cranial nerves II through XII grossly intact. Deep tendon reflexes 2+ bilateral lower extremities.  PSYCHIATRIC:  The patient is oriented to person, place and time.   LABORATORY AND RADIOLOGICAL DATA:  BNP 93. Troponin negative. White blood cell count 8.1, H and H 11.9 and 36.8, platelet count of 148. D-dimer borderline at 0.66. Glucose 221, BUN 29, creatinine 1.09, sodium 128, potassium 4.2, chloride 86, CO2 of 35, calcium 9.3. Liver function tests:  Normal range. ABG shows a pH of 7.47, pCO2 of 56, pO2 of 89, bicarb 40.8, O2 saturation 98% on 28% oxygen. Urinalysis negative.   EKG:  Normal sinus rhythm, 98 beats per minute, left atrial enlargement.   ASSESSMENT AND PLAN: 1.  Acute on chronic respiratory failure. Continue oxygen supplementation. Emergency Room physician ordered a V/Q scan with the borderline D-dimer. Nursing staff was unable to get a 20-gauge IV, so a CT scan could not be done. Less likely pulmonary embolism, more likely chronic obstructive pulmonary disease exacerbation.   2.  Chronic obstructive pulmonary disease exacerbation, probably from walking around on the discharge home from [facility]. The patient states that she had not been walking that much at [facility]. Will give IV Solu-Medrol, doxycycline, DuoNeb. Add Spiriva to the Symbicort and continue to monitor on the high dose steroids.   3.  History of congestive heart failure, diastolic in nature. Does have some increasing edema. BNP is actually normal range. Will give a dose of IV Lasix 20 mg now, and 40 mg IV daily. No beta blocker with the end-stage chronic obstructive pulmonary disease.   4.  Diabetes. Continue glipizide and metformin on sliding scale also.   5.  Obesity. Body mass index 38.5, weight loss definitely needed.   6.  Weakness. Will get Physical Therapy evaluation, may end up  needing rehab.   7.  Hyponatremia, likely secondary from chronic congestive heart failure. Will put on fluid restriction and recheck a sodium in the morning.  Time spent on admission:  55 minutes.    ____________________________ Herschell Dimes. Renae Gloss, MD rjw:mr D: 02/26/2013 18:38:16 ET T: 02/26/2013 19:32:29 ET JOB#: [id number]  cc: Herschell Dimes. Renae Gloss, MD, <Dictator> Leo Grosser, MD  Salley Scarlet MD ELECTRONICALLY SIGNED 02/27/2013 13:52

## 2015-03-14 NOTE — Discharge Summary (Signed)
PATIENT NAME:  Jenna Richardson, Jenna Richardson MR#:  [id number] DATE OF BIRTH:  January 25, 1946  DATE OF ADMISSION:  01/31/2013 DATE OF DISCHARGE:  02/09/2013  DISCHARGE DIAGNOSES:  1.  Chronic obstructive pulmonary disease exacerbation. 2. Acute on chronic respiratory failure due to chronic obstructive pulmonary disease and congestive heart failure.  3.  Acute on chronic diastolic congestive heart failure.  4.  Hypertension.  5.  Diabetes.  6.  Depression.  7.  Obesity.  PRIMARY CARE PHYSICIAN:  Lorie PhenixNancy Maloney, MD  PRIMARY PULMONOLOGIST: Ned ClinesHerbon Fleming, MD  HISTORY OF PRESENT ILLNESS: The patient is a 69 year old female with past medical history of COPD, diabetes and on home oxygen who presented to Emergency Room with complaint of shortness of breath for 1 to 2 days. Her baseline is she uses 2 liters of oxygen at home via nasal cannula and she is somewhat short of breath with ambulation, but able to manage her day-to-day activities. In the last few years, she never had exacerbation episode of COPD and said it was very well-controlled with nebulizers at home and oxygen supplementation. For 1 day she started having severe shortness of breath, tried home nebulizer multiple times, could not help, and so she decided to come to the Emergency Room. The ER physician gave multiple respiratory treatments, but did not help and so she was admitted for further management of that.  On admission, in the ER, her respiratory rate was up to 30 and she was saturating 96% to 97% with 4 liters nasal cannula oxygen supplementation with extensive expiratory wheezing. D-dimer on admission was 0.29. Chest x-ray on admission showed cardiomegaly, right lung base atelectasis versus infiltrate, and evidence of COPD.   HOSPITAL COURSE AND STAY: For her COPD exacerbation, she was started on IV Solu-Medrol, Advair, Spiriva, and bronchodilator treatment. She was also given erythromycin for total 7 days and ceftriaxone and Rocephin for 3 days while she  was in the hospital. Her symptoms continued persistently and we were unable to taper her steroids as well as to put her on oral steroids. CT scan of the chest was done to rule out pulmonary embolism, which was negative. Pulmonologist, Dr. Meredeth IdeFleming, also saw her. As she had 1 episode of encephalopathy due to CO2 retention, she was placed on BiPAP, but she improved with BiPAP use significantly.  Presently, at the time of discharge, on 21st of March, she is on 2 liters nasal cannula oxygen, with significant wheezing bilateral chest and distant air sound with decreased air entry.  Using oxygen constantly, 2 liters nasal cannula.   Other medical issues addressed during the hospital stay:  1.  Acute respiratory failure. As mentioned above.  2.  Acute on chronic congestive heart failure. Echocardiogram was done and it resulted normal systolic function, normal left ventricular size and wall thickness, left ventricular posterior wall thickness mildly increased, global left ventricular systolic function normal, ejection fraction 55% to 60%, impaired relaxation pattern of LV diastolic filling, left atrium mildly dilated, no evidence of pericardial effusion She was started on IV Lasix and she responded nicely with that for 3 days with having more than 5 to 6 liters of diuresis, but her respiratory symptoms did not improve so we switched her back to oral Lasix and she remained stable.  3.  Hypertension. Remained stable on Lasix 20 mg oral daily, Cardizem 180 mg daily and lisinopril 5 mg.  4.  Diabetes.  Was remaining hyperglycemic during the hospital stay and we had to go up regularly on insulin. She was  continued on oral medication plus increased dose of Lantus and short-acting insulin before meals, at the time of discharge.  5.  Depression. After 2 to 3 days of hospitalization she was having flat affect and was not ready to cooperate or participate with physical therapy. Called psych consult and initially started on  Sertraline but then psych changed to Prozac and she had good response and improvement in her mental status and mood.   CONSULTANTS:  Dr. Ned Clines of pulmonary and psychiatry.  LABORATORY AND DIAGNOSTIC DATA: CT for embolism: No pulmonary emboli. Subcentimeter low-attenuation focus in the pancreas too small to characterize, but indeterminate.  Followup abdominal CT is recommended in 6 months with contrast.  X-ray of the chest on 21st of March reported worsening in the appearance of lungs and pulmonary vascular which may reflect CHF or interstitial edema. Atelectasis in the lingula is suspected. No significant pleural fluid collection is demonstrated.   Echo result as discussed above in assessment and further management.    Hemoglobin 10.6.  Creatine remained normal and stable in the hospital. On the day of discharge, 21st of March, potassium level is 5.3 and we gave Kayexalate.  Please follow the potassium level after 1 to 2 days.   DISCHARGE MEDICATIONS: 1.  Flovent 2 puffs inhaled 2 times a day. 2.  Aspirin 81 mg once a day. 3.  Diltiazem 180 mg once a day.  4.  Methylprednisone 40 mg IV every 6 hours. 5.  Percocet every 4 hours as needed for pain. 6.  Lisinopril 5 mg tablet once a day. 7.  Fluoxetine 20 mg once a day. 8.  Glipizide 5 mg 2 times a day. 9.  Metformin 850 mg 2 times a day. 10.  Insulin Lantus 10 units sub-Q once a day. 11.  Insulin Aspart 5 units sub-Q 3 times a day before meals. 12.  Salmeterol 1 puff 2 times a day. 13.  Spiriva 18 mcg inhalation capsule once a day. 14.  Furosemide 20 mg oral tablet once a day. 15.  Cyclobenzaprine 10 mg oral tablet every 8 hours as needed. 16.  Protonix 40 mg oral tablet once a day.   OXYGEN ON DISCHARGE: Yes, nasal cannula, 3 liters continuous.  DIET ON DISCHARGE: Low sodium, carbohydrate controlled, regular consistency.   ACTIVITY LIMITATION: As tolerated.   TIME FRAME TO FOLLOWUP:  Within 2 to 3 weeks. Advised to follow  with Dr. Meredeth Ide, pulmonologist, and we need to check her potassium level in 1 to 2 days.  ____________________________ Hope Pigeon Elisabeth Pigeon, MD [id number] D: 02/09/2013 13:26:35 ET    T: 02/09/2013 14:10:59 ET        JOB#: [id number] cc: Hope Pigeon. Elisabeth Pigeon, MD, <Dictator> Altamese Dilling MD ELECTRONICALLY SIGNED 02/22/2013 14:50
